# Patient Record
Sex: Female | Born: 1945 | Race: White | Hispanic: No | Marital: Married | Smoking: Former smoker
Health system: Southern US, Community
[De-identification: ages and names within clinical notes are randomized; demographics above are authoritative.]

## PROBLEM LIST (undated history)

## (undated) DIAGNOSIS — J40 Bronchitis, not specified as acute or chronic: Secondary | ICD-10-CM

---

## 2010-08-15 DIAGNOSIS — F17200 Nicotine dependence, unspecified, uncomplicated: Secondary | ICD-10-CM | POA: Insufficient documentation

## 2010-08-15 DIAGNOSIS — N3941 Urge incontinence: Secondary | ICD-10-CM | POA: Insufficient documentation

## 2010-08-15 DIAGNOSIS — K219 Gastro-esophageal reflux disease without esophagitis: Secondary | ICD-10-CM | POA: Insufficient documentation

## 2010-08-15 DIAGNOSIS — G8929 Other chronic pain: Secondary | ICD-10-CM | POA: Insufficient documentation

## 2010-08-15 DIAGNOSIS — G43909 Migraine, unspecified, not intractable, without status migrainosus: Secondary | ICD-10-CM | POA: Insufficient documentation

## 2010-08-15 DIAGNOSIS — M25859 Other specified joint disorders, unspecified hip: Secondary | ICD-10-CM | POA: Insufficient documentation

## 2010-08-15 DIAGNOSIS — J302 Other seasonal allergic rhinitis: Secondary | ICD-10-CM | POA: Insufficient documentation

## 2010-08-15 DIAGNOSIS — M8580 Other specified disorders of bone density and structure, unspecified site: Secondary | ICD-10-CM | POA: Insufficient documentation

## 2010-12-30 DIAGNOSIS — L309 Dermatitis, unspecified: Secondary | ICD-10-CM | POA: Insufficient documentation

## 2010-12-31 DIAGNOSIS — E785 Hyperlipidemia, unspecified: Secondary | ICD-10-CM | POA: Insufficient documentation

## 2012-03-29 DIAGNOSIS — N200 Calculus of kidney: Secondary | ICD-10-CM | POA: Insufficient documentation

## 2013-12-05 DIAGNOSIS — M19041 Primary osteoarthritis, right hand: Secondary | ICD-10-CM | POA: Insufficient documentation

## 2020-12-11 ENCOUNTER — Other Ambulatory Visit: Payer: Self-pay | Admitting: Family Medicine

## 2020-12-11 DIAGNOSIS — Z1231 Encounter for screening mammogram for malignant neoplasm of breast: Secondary | ICD-10-CM

## 2021-02-13 ENCOUNTER — Inpatient Hospital Stay
Admission: EM | Admit: 2021-02-13 | Discharge: 2021-02-15 | DRG: 871 | Disposition: A | Discharge status: Home / Self Care | Payer: Medicare Other | Attending: Internal Medicine | Admitting: Internal Medicine

## 2021-02-13 ENCOUNTER — Encounter: Payer: Self-pay | Admitting: Emergency Medicine

## 2021-02-13 ENCOUNTER — Other Ambulatory Visit: Payer: Self-pay

## 2021-02-13 ENCOUNTER — Emergency Department: Payer: Medicare Other

## 2021-02-13 DIAGNOSIS — R809 Proteinuria, unspecified: Secondary | ICD-10-CM | POA: Diagnosis present

## 2021-02-13 DIAGNOSIS — Z87440 Personal history of urinary (tract) infections: Secondary | ICD-10-CM | POA: Diagnosis not present

## 2021-02-13 DIAGNOSIS — R300 Dysuria: Secondary | ICD-10-CM | POA: Diagnosis present

## 2021-02-13 DIAGNOSIS — K219 Gastro-esophageal reflux disease without esophagitis: Secondary | ICD-10-CM | POA: Diagnosis present

## 2021-02-13 DIAGNOSIS — Z885 Allergy status to narcotic agent status: Secondary | ICD-10-CM | POA: Diagnosis not present

## 2021-02-13 DIAGNOSIS — E871 Hypo-osmolality and hyponatremia: Secondary | ICD-10-CM | POA: Diagnosis present

## 2021-02-13 DIAGNOSIS — Z20822 Contact with and (suspected) exposure to covid-19: Secondary | ICD-10-CM | POA: Diagnosis present

## 2021-02-13 DIAGNOSIS — R5381 Other malaise: Secondary | ICD-10-CM | POA: Diagnosis present

## 2021-02-13 DIAGNOSIS — M549 Dorsalgia, unspecified: Secondary | ICD-10-CM | POA: Diagnosis present

## 2021-02-13 DIAGNOSIS — R9431 Abnormal electrocardiogram [ECG] [EKG]: Secondary | ICD-10-CM | POA: Diagnosis not present

## 2021-02-13 DIAGNOSIS — D649 Anemia, unspecified: Secondary | ICD-10-CM | POA: Diagnosis present

## 2021-02-13 DIAGNOSIS — Z809 Family history of malignant neoplasm, unspecified: Secondary | ICD-10-CM | POA: Diagnosis not present

## 2021-02-13 DIAGNOSIS — Z87442 Personal history of urinary calculi: Secondary | ICD-10-CM

## 2021-02-13 DIAGNOSIS — J440 Chronic obstructive pulmonary disease with acute lower respiratory infection: Secondary | ICD-10-CM | POA: Diagnosis present

## 2021-02-13 DIAGNOSIS — R404 Transient alteration of awareness: Secondary | ICD-10-CM | POA: Diagnosis present

## 2021-02-13 DIAGNOSIS — E785 Hyperlipidemia, unspecified: Secondary | ICD-10-CM | POA: Diagnosis present

## 2021-02-13 DIAGNOSIS — J189 Pneumonia, unspecified organism: Secondary | ICD-10-CM | POA: Diagnosis present

## 2021-02-13 DIAGNOSIS — Z8262 Family history of osteoporosis: Secondary | ICD-10-CM

## 2021-02-13 DIAGNOSIS — Z87891 Personal history of nicotine dependence: Secondary | ICD-10-CM | POA: Diagnosis not present

## 2021-02-13 DIAGNOSIS — I4891 Unspecified atrial fibrillation: Secondary | ICD-10-CM | POA: Diagnosis present

## 2021-02-13 DIAGNOSIS — A419 Sepsis, unspecified organism: Secondary | ICD-10-CM | POA: Diagnosis present

## 2021-02-13 DIAGNOSIS — E860 Dehydration: Secondary | ICD-10-CM | POA: Diagnosis present

## 2021-02-13 DIAGNOSIS — R443 Hallucinations, unspecified: Secondary | ICD-10-CM | POA: Diagnosis present

## 2021-02-13 DIAGNOSIS — G8929 Other chronic pain: Secondary | ICD-10-CM | POA: Diagnosis present

## 2021-02-13 DIAGNOSIS — J9601 Acute respiratory failure with hypoxia: Secondary | ICD-10-CM | POA: Diagnosis present

## 2021-02-13 HISTORY — DX: Bronchitis, not specified as acute or chronic: J40

## 2021-02-13 LAB — URINALYSIS, ROUTINE W REFLEX MICROSCOPIC
Bilirubin Urine: NEGATIVE
Glucose, UA: NEGATIVE mg/dL
Ketones, ur: 20 mg/dL — AB
Nitrite: NEGATIVE
Protein, ur: 100 mg/dL — AB
Specific Gravity, Urine: 1.028 (ref 1.005–1.030)
pH: 5 (ref 5.0–8.0)

## 2021-02-13 LAB — CBC WITH DIFFERENTIAL/PLATELET
Abs Immature Granulocytes: 0.05 10*3/uL (ref 0.00–0.07)
Basophils Absolute: 0 10*3/uL (ref 0.0–0.1)
Basophils Relative: 0 %
Eosinophils Absolute: 0 10*3/uL (ref 0.0–0.5)
Eosinophils Relative: 0 %
HCT: 35.5 % — ABNORMAL LOW (ref 36.0–46.0)
Hemoglobin: 11.9 g/dL — ABNORMAL LOW (ref 12.0–15.0)
Immature Granulocytes: 0 %
Lymphocytes Relative: 5 %
Lymphs Abs: 0.6 10*3/uL — ABNORMAL LOW (ref 0.7–4.0)
MCH: 29.6 pg (ref 26.0–34.0)
MCHC: 33.5 g/dL (ref 30.0–36.0)
MCV: 88.3 fL (ref 80.0–100.0)
Monocytes Absolute: 0.9 10*3/uL (ref 0.1–1.0)
Monocytes Relative: 8 %
Neutro Abs: 9.7 10*3/uL — ABNORMAL HIGH (ref 1.7–7.7)
Neutrophils Relative %: 87 %
Platelets: 195 10*3/uL (ref 150–400)
RBC: 4.02 MIL/uL (ref 3.87–5.11)
RDW: 12.8 % (ref 11.5–15.5)
WBC: 11.3 10*3/uL — ABNORMAL HIGH (ref 4.0–10.5)
nRBC: 0 % (ref 0.0–0.2)

## 2021-02-13 LAB — COMPREHENSIVE METABOLIC PANEL
ALT: 28 U/L (ref 0–44)
AST: 36 U/L (ref 15–41)
Albumin: 3.4 g/dL — ABNORMAL LOW (ref 3.5–5.0)
Alkaline Phosphatase: 90 U/L (ref 38–126)
Anion gap: 10 (ref 5–15)
BUN: 16 mg/dL (ref 8–23)
CO2: 24 mmol/L (ref 22–32)
Calcium: 8.3 mg/dL — ABNORMAL LOW (ref 8.9–10.3)
Chloride: 98 mmol/L (ref 98–111)
Creatinine, Ser: 1.05 mg/dL — ABNORMAL HIGH (ref 0.44–1.00)
GFR, Estimated: 55 mL/min — ABNORMAL LOW (ref >60)
Glucose, Bld: 136 mg/dL — ABNORMAL HIGH (ref 70–99)
Potassium: 3.6 mmol/L (ref 3.5–5.1)
Sodium: 132 mmol/L — ABNORMAL LOW (ref 135–145)
Total Bilirubin: 1.2 mg/dL (ref 0.3–1.2)
Total Protein: 7.4 g/dL (ref 6.5–8.1)

## 2021-02-13 LAB — BRAIN NATRIURETIC PEPTIDE: B Natriuretic Peptide: 81.6 pg/mL (ref 0.0–100.0)

## 2021-02-13 LAB — BLOOD GAS, VENOUS
Acid-Base Excess: 1.3 mmol/L (ref 0.0–2.0)
Bicarbonate: 26.6 mmol/L (ref 20.0–28.0)
O2 Saturation: 44.5 %
Patient temperature: 37
pCO2, Ven: 44 mmHg (ref 44.0–60.0)
pH, Ven: 7.39 (ref 7.250–7.430)
pO2, Ven: <31 mmHg — CL (ref 32.0–45.0)

## 2021-02-13 LAB — PROTIME-INR
INR: 1.1 (ref 0.8–1.2)
Prothrombin Time: 14.3 seconds (ref 11.4–15.2)

## 2021-02-13 LAB — TROPONIN I (HIGH SENSITIVITY)
Troponin I (High Sensitivity): 16 ng/L (ref <18)
Troponin I (High Sensitivity): 14 ng/L (ref <18)

## 2021-02-13 LAB — RESP PANEL BY RT-PCR (FLU A&B, COVID) ARPGX2
Influenza A by PCR: NEGATIVE
Influenza B by PCR: NEGATIVE
SARS Coronavirus 2 by RT PCR: NEGATIVE

## 2021-02-13 LAB — PROCALCITONIN: Procalcitonin: 19.08 ng/mL

## 2021-02-13 LAB — STREP PNEUMONIAE URINARY ANTIGEN: Strep Pneumo Urinary Antigen: NEGATIVE

## 2021-02-13 LAB — TSH: TSH: 0.518 u[IU]/mL (ref 0.350–4.500)

## 2021-02-13 LAB — LACTIC ACID, PLASMA: Lactic Acid, Venous: 1.6 mmol/L (ref 0.5–1.9)

## 2021-02-13 MED ORDER — IPRATROPIUM-ALBUTEROL 0.5-2.5 (3) MG/3ML IN SOLN
3.0000 mL | Freq: Once | RESPIRATORY_TRACT | Status: AC
  Administered 2021-02-13: 3 mL via RESPIRATORY_TRACT

## 2021-02-13 MED ORDER — PREDNISONE 20 MG PO TABS
40.0000 mg | ORAL_TABLET | Freq: Every day | ORAL | Status: DC
  Administered 2021-02-14 – 2021-02-15 (×2): 40 mg via ORAL
  Filled 2021-02-13 (×2): qty 2

## 2021-02-13 MED ORDER — OXYBUTYNIN CHLORIDE 5 MG PO TABS
5.0000 mg | ORAL_TABLET | Freq: Two times a day (BID) | ORAL | Status: DC
  Administered 2021-02-13 – 2021-02-15 (×4): 5 mg via ORAL
  Filled 2021-02-13 (×5): qty 1

## 2021-02-13 MED ORDER — ROSUVASTATIN CALCIUM 10 MG PO TABS
10.0000 mg | ORAL_TABLET | Freq: Every day | ORAL | Status: DC
  Administered 2021-02-14 – 2021-02-15 (×2): 10 mg via ORAL
  Filled 2021-02-13 (×3): qty 1

## 2021-02-13 MED ORDER — HYDROXYZINE HCL 10 MG PO TABS
10.0000 mg | ORAL_TABLET | Freq: Every day | ORAL | Status: DC
  Administered 2021-02-13 – 2021-02-14 (×2): 10 mg via ORAL
  Filled 2021-02-13 (×4): qty 1

## 2021-02-13 MED ORDER — ACETAMINOPHEN 500 MG PO TABS
1000.0000 mg | ORAL_TABLET | Freq: Once | ORAL | Status: AC
  Administered 2021-02-13: 1000 mg via ORAL
  Filled 2021-02-13: qty 2

## 2021-02-13 MED ORDER — MONTELUKAST SODIUM 10 MG PO TABS
10.0000 mg | ORAL_TABLET | Freq: Every day | ORAL | Status: DC
  Administered 2021-02-14 – 2021-02-15 (×2): 10 mg via ORAL
  Filled 2021-02-13 (×2): qty 1

## 2021-02-13 MED ORDER — IPRATROPIUM-ALBUTEROL 0.5-2.5 (3) MG/3ML IN SOLN
3.0000 mL | Freq: Once | RESPIRATORY_TRACT | Status: AC
  Administered 2021-02-13: 3 mL via RESPIRATORY_TRACT
  Filled 2021-02-13: qty 6

## 2021-02-13 MED ORDER — METHYLPREDNISOLONE SODIUM SUCC 125 MG IJ SOLR
125.0000 mg | Freq: Once | INTRAMUSCULAR | Status: AC
  Administered 2021-02-13: 125 mg via INTRAVENOUS
  Filled 2021-02-13: qty 2

## 2021-02-13 MED ORDER — SODIUM CHLORIDE 0.9 % IV SOLN
500.0000 mg | Freq: Once | INTRAVENOUS | Status: AC
  Administered 2021-02-13: 500 mg via INTRAVENOUS
  Filled 2021-02-13: qty 5

## 2021-02-13 MED ORDER — PANTOPRAZOLE SODIUM 40 MG PO TBEC
40.0000 mg | DELAYED_RELEASE_TABLET | Freq: Every day | ORAL | Status: DC
  Administered 2021-02-14 – 2021-02-15 (×2): 40 mg via ORAL
  Filled 2021-02-13 (×2): qty 1

## 2021-02-13 MED ORDER — ENOXAPARIN SODIUM 40 MG/0.4ML IJ SOSY
40.0000 mg | PREFILLED_SYRINGE | INTRAMUSCULAR | Status: DC
  Administered 2021-02-13 – 2021-02-14 (×2): 40 mg via SUBCUTANEOUS
  Filled 2021-02-13 (×2): qty 0.4

## 2021-02-13 MED ORDER — GUAIFENESIN ER 600 MG PO TB12
600.0000 mg | ORAL_TABLET | Freq: Two times a day (BID) | ORAL | Status: DC
  Administered 2021-02-13 – 2021-02-15 (×5): 600 mg via ORAL
  Filled 2021-02-13 (×5): qty 1

## 2021-02-13 MED ORDER — MAGNESIUM OXIDE 400 MG PO TABS
400.0000 mg | ORAL_TABLET | Freq: Every day | ORAL | Status: DC
  Administered 2021-02-14 – 2021-02-15 (×2): 400 mg via ORAL
  Filled 2021-02-13 (×5): qty 1

## 2021-02-13 MED ORDER — VITAMIN B-12 1000 MCG PO TABS
1000.0000 ug | ORAL_TABLET | Freq: Every day | ORAL | Status: DC
  Administered 2021-02-14 – 2021-02-15 (×2): 1000 ug via ORAL
  Filled 2021-02-13 (×3): qty 1

## 2021-02-13 MED ORDER — ALBUTEROL SULFATE (2.5 MG/3ML) 0.083% IN NEBU: 2.5000 mg | INHALATION_SOLUTION | RESPIRATORY_TRACT | Status: DC | PRN

## 2021-02-13 MED ORDER — SODIUM CHLORIDE 0.9 % IV SOLN
2.0000 g | Freq: Once | INTRAVENOUS | Status: AC
  Administered 2021-02-13: 2 g via INTRAVENOUS
  Filled 2021-02-13: qty 20

## 2021-02-13 MED ORDER — SODIUM CHLORIDE 0.9 % IV SOLN: INTRAVENOUS | Status: AC

## 2021-02-13 MED ORDER — ADULT MULTIVITAMIN W/MINERALS CH
1.0000 | ORAL_TABLET | Freq: Every day | ORAL | Status: DC
  Administered 2021-02-14 – 2021-02-15 (×2): 1 via ORAL
  Filled 2021-02-13 (×2): qty 1

## 2021-02-13 NOTE — ED Triage Notes (Signed)
Pt comes into the ED via [facility] clinic for increased [facility] and AMS.  Pt recently diagnosed with bronchitis and UTI.  [facility] clinic saw her today and her O2 was in the 80's room air so they put her on 2L and brought her over.  Pt confused in triage at this time.  Pt has strong cough in triage.

## 2021-02-13 NOTE — ED Provider Notes (Signed)
[facility] Provider Note    Event Date/Time   First MD Initiated Contact with Patient 02/13/21 1019     (approximate)   History   Shortness of Breath and Altered Mental Status   HPI  Deborah Herrera is a 76 y.o. female here with shortness of breath and altered mental status.  History provided primarily by the patient's husband.  He reports that over the last week, the patient has had cough, increasing confusion, and decreased p.o. appetite.  She has been hallucinating.  She has had subjective fevers.  She has been progressively weaker and is having difficulty getting around the house today.  She went to her PCP and was sent here for further evaluation.  She denies any complaints on my assessment, though she does not necessarily know why she is here.  Denies any chest pain or shortness of breath.  No nausea or vomiting.  No diarrhea.  She has had some mild dysuria, with a history of UTIs.  No known specific sick contacts.  No known COVID exposures.  Remainder of history limited due to confusion.     Physical Exam   Triage Vital Signs: ED Triage Vitals  Enc Vitals Group     BP 02/13/21 1100 105/71     Pulse Rate 02/13/21 1031 (!) 120     Resp 02/13/21 1030 (!) 24     Temp 02/13/21 1030 99.8 F (37.7 C)     Temp Source 02/13/21 1030 Oral     SpO2 02/13/21 1030 94 %     Weight 02/13/21 1010 148 lb (67.1 kg)     Height 02/13/21 1010 5\' 5"  (1.651 m)     Head Circumference --      Peak Flow --      Pain Score 02/13/21 1010 5     Pain Loc --      Pain Edu? --      Excl. in GC? --     Most recent vital signs: Vitals:   02/13/21 1454 02/13/21 1528  BP: (!) 114/49 113/68  Pulse: (!) 114 96  Resp: 20 20  Temp:    SpO2: 94% 94%     General: Awake, no distress.  CV:  Good peripheral perfusion.  Tachycardia.  No murmur. Resp:  Normal effort.  Bilateral wheezing with slight rhonchi. Abd:  No distention.  Other:  Oriented to person and place, not time.   Moves all extremities with 5-5 strength.  Cranials 2 through 12 grossly intact.  Normal sensation light touch.  Gait deferred.   ED Results / Procedures / Treatments   Labs (all labs ordered are listed, but only abnormal results are displayed) Labs Reviewed  COMPREHENSIVE METABOLIC PANEL - Abnormal; Notable for the following components:      Result Value   Sodium 132 (*)    Glucose, Bld 136 (*)    Creatinine, Ser 1.05 (*)    Calcium 8.3 (*)    Albumin 3.4 (*)    GFR, Estimated 55 (*)    All other components within normal limits  CBC WITH DIFFERENTIAL/PLATELET - Abnormal; Notable for the following components:   WBC 11.3 (*)    Hemoglobin 11.9 (*)    HCT 35.5 (*)    Neutro Abs 9.7 (*)    Lymphs Abs 0.6 (*)    All other components within normal limits  URINALYSIS, ROUTINE W REFLEX MICROSCOPIC - Abnormal; Notable for the following components:   Color, Urine AMBER (*)  APPearance HAZY (*)    Hgb urine dipstick SMALL (*)    Ketones, ur 20 (*)    Protein, ur 100 (*)    Leukocytes,Ua TRACE (*)    Bacteria, UA RARE (*)    All other components within normal limits  BLOOD GAS, VENOUS - Abnormal; Notable for the following components:   pO2, Ven <31.0 (*)    All other components within normal limits  RESP PANEL BY RT-PCR (FLU A&B, COVID) ARPGX2  CULTURE, BLOOD (ROUTINE X 2)  CULTURE, BLOOD (ROUTINE X 2)  EXPECTORATED SPUTUM ASSESSMENT W GRAM STAIN, RFLX TO RESP C  RESPIRATORY PANEL BY PCR  MRSA NEXT GEN BY PCR, NASAL  URINE CULTURE  LACTIC ACID, PLASMA  PROTIME-INR  PROCALCITONIN  BRAIN NATRIURETIC PEPTIDE  TSH  LACTIC ACID, PLASMA  LEGIONELLA PNEUMOPHILA SEROGP 1 UR AG  STREP PNEUMONIAE URINARY ANTIGEN  HEMOGLOBIN A1C  TROPONIN I (HIGH SENSITIVITY)  TROPONIN I (HIGH SENSITIVITY)     EKG Sinus tachycardia, ventricular rate 123.  PR 152, QRS 110, QTc 460.  No acute ST elevations or depressions.  No acute ischemia or infarct.   RADIOLOGY Chest x-ray: Likely  interstitial pneumonia, no pleural effusion on my review.  Agree with radiology interpretation.    PROCEDURES:  Critical Care performed: Yes, see critical care procedure note(s)  .Critical Care Performed by: Shaune Pollack, MD Authorized by: Shaune Pollack, MD   Critical care provider statement:    Critical care time (minutes):  30   Critical care time was exclusive of:  Separately billable procedures and treating other patients   Critical care was necessary to treat or prevent imminent or life-threatening deterioration of the following conditions:  Cardiac failure, circulatory failure and sepsis   Critical care was time spent personally by me on the following activities:  Development of treatment plan with patient or surrogate, discussions with consultants, evaluation of patient's response to treatment, examination of patient, ordering and review of laboratory studies, ordering and review of radiographic studies, ordering and performing treatments and interventions, pulse oximetry, re-evaluation of patient's condition and review of old charts .1-3 Lead EKG Interpretation Performed by: Shaune Pollack, MD Authorized by: Shaune Pollack, MD     Interpretation: normal     ECG rate:  100-130   ECG rate assessment: tachycardic     Rhythm: atrial fibrillation     Ectopy: none     Conduction: normal   Comments:     Indication:Sepsis, weakness    MEDICATIONS ORDERED IN ED: Medications  enoxaparin (LOVENOX) injection 40 mg (has no administration in time range)  0.9 %  sodium chloride infusion (0 mLs Intravenous Hold 02/13/21 1332)  albuterol (PROVENTIL) (2.5 MG/3ML) 0.083% nebulizer solution 2.5 mg (has no administration in time range)  guaiFENesin (MUCINEX) 12 hr tablet 600 mg (600 mg Oral Given 02/13/21 1346)  predniSONE (DELTASONE) tablet 40 mg (has no administration in time range)  ipratropium-albuterol (DUONEB) 0.5-2.5 (3) MG/3ML nebulizer solution 3 mL (3 mLs Nebulization Given  02/13/21 1055)  acetaminophen (TYLENOL) tablet 1,000 mg (1,000 mg Oral Given 02/13/21 1057)  ipratropium-albuterol (DUONEB) 0.5-2.5 (3) MG/3ML nebulizer solution 3 mL (3 mLs Nebulization Given 02/13/21 1055)  cefTRIAXone (ROCEPHIN) 2 g in sodium chloride 0.9 % 100 mL IVPB (0 g Intravenous Stopped 02/13/21 1126)  azithromycin (ZITHROMAX) 500 mg in sodium chloride 0.9 % 250 mL IVPB (0 mg Intravenous Stopped 02/13/21 1232)  methylPREDNISolone sodium succinate (SOLU-MEDROL) 125 mg/2 mL injection 125 mg (125 mg Intravenous Given 02/13/21 1056)  IMPRESSION / MDM / ASSESSMENT AND PLAN / ED COURSE  I reviewed the triage vital signs and the nursing notes.                               The patient is on the cardiac monitor to evaluate for evidence of arrhythmia and/or significant heart rate changes.   Ddx: Sepsis 2/2 PNA, UTI, metabolic encephalopathy, polypharmacy   Plan: IV broad spectrum ABX, IVF resuscitation (30 cc/kg), admit to medicine for further stabilization/treatment.   MEDICATIONS GIVEN IN ED: Medications  enoxaparin (LOVENOX) injection 40 mg (has no administration in time range)  0.9 %  sodium chloride infusion (0 mLs Intravenous Hold 02/13/21 1332)  albuterol (PROVENTIL) (2.5 MG/3ML) 0.083% nebulizer solution 2.5 mg (has no administration in time range)  guaiFENesin (MUCINEX) 12 hr tablet 600 mg (600 mg Oral Given 02/13/21 1346)  predniSONE (DELTASONE) tablet 40 mg (has no administration in time range)  ipratropium-albuterol (DUONEB) 0.5-2.5 (3) MG/3ML nebulizer solution 3 mL (3 mLs Nebulization Given 02/13/21 1055)  acetaminophen (TYLENOL) tablet 1,000 mg (1,000 mg Oral Given 02/13/21 1057)  ipratropium-albuterol (DUONEB) 0.5-2.5 (3) MG/3ML nebulizer solution 3 mL (3 mLs Nebulization Given 02/13/21 1055)  cefTRIAXone (ROCEPHIN) 2 g in sodium chloride 0.9 % 100 mL IVPB (0 g Intravenous Stopped 02/13/21 1126)  azithromycin (ZITHROMAX) 500 mg in sodium chloride 0.9 % 250 mL IVPB (0 mg Intravenous  Stopped 02/13/21 1232)  methylPREDNISolone sodium succinate (SOLU-MEDROL) 125 mg/2 mL injection 125 mg (125 mg Intravenous Given 02/13/21 1056)     ED COURSE: Pt given tylenol, fluids, abx with improvement in vitals. BP improved. Sats stable. Labs obtained and reviewed, remarkable for leukocytosis with white blood cell count 11.3, mild hyponatremia likely due to hypovolemia but normal renal function, significantly elevated procalcitonin of 19 concerning for bacterial illness, reassuring blood gas without significant hypercapnia, UA with ketonuria consistent with dehydration, very mild pyuria.  Blood cultures have been sent.  INR normal.  Chest x-ray obtained and reviewed and is concerning for pneumonia.   Consults: Hospitalist consulted and case discussed, will admit.   EMR reviewed   Visit from Dr. Darrick Penna (PCP) visit today, where pt was febrile, tachycardic, with concern for sepsis.   FINAL CLINICAL IMPRESSION(S) / ED DIAGNOSES   Final diagnoses:  Sepsis due to pneumonia (HCC)  Transient alteration of awareness     Rx / DC Orders   ED Discharge Orders     None        Note:  This document was prepared using Dragon voice recognition software and may include unintentional dictation errors.   Shaune Pollack, MD 02/13/21 [phone]

## 2021-02-13 NOTE — H&P (Signed)
History and Physical    Deborah FurryJane Herrera RUE:[id number]:[id number] DOB: [birth year] DOA: 02/13/2021  [facility]  [facility] coming from:[place]   I have personally briefly reviewed patient's old medical [facility]  Chief Complaint:cough, congestion,sob,burning with urination  HPI: Deborah FurryJane Herrera is a 76 y.o. female with medical history significant of  HLD,  GERD, Migranes, Bell's palsy,past hx of tobacco abuse quit 6 years ago,denies history of COPD but does endorse history of frequent URIs. [facility] with complaint of cough,congestion,sob and dysuria. [facility] was noted to be altered with sat of 83% on RA, hr 130, bp 149/70. Patient was placed on 2L [place] with improvement to 90's and transferred to ED. Of note patient has been ill with URI for few weeks, however over the last few days has had progression of symptoms leading to presentation at pcp office. Per patient and husband she has been ill for over 2 weeks despite treatment with antibiotics and steroid pack as out patient. Patient states in addition to above symptoms she has had muscle aches above her typical baseline, dizziness, weakness as well as poor appetite. Per husband he was also ill with URI signs and symptoms during the same periods which have now resolved.     ED Course:  IN ED Vitals  temp 99.8, rr 24, sat 84 on 2L ,hr 120,   Labs:wbc 11.3, hb 11.9,lactic 1.6 Na 132, cr 1.05, EKG: sinus tachycardia , inferior q, interventricular conduction delay  CE within normal limts Respiratory panel :neg  BNP 81.6 Cxr: + infiltrate -pna  Patient on exam noted to have wheezing  Patient tx with nebs/solumedrol / as well as ctx azithromycin for cap  And slated for admission to medtele Review of Systems: As per HPI otherwise 10 point review of systems negative.   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 Her smoking use included cigarettes. She has never used smokeless tobacco. She reports that she does not currently use alcohol. No history on file for drug use.  Allergies  Allergen Reactions   Codeine Nausea And Vomiting    Family History   Hepatitis Mother   Bone cancer Father   Osteoporosis (Thinning of bones) Sister   Cataracts Sister  Prior to Admission medications   Not on File    Physical Exam: Vitals:   02/13/21 1030 02/13/21 1031 02/13/21 1100 02/13/21 1131  BP:   105/71 121/67  Pulse:  (!) 120  (!) 118  Resp: (!) 24   (!) 22  Temp: 99.8 F (37.7  C)     TempSrc: Oral     SpO2: 94%   94%  Weight:      Height:        Constitutional: NAD, calm, comfortable Vitals:   02/13/21 1030 02/13/21 1031 02/13/21 1100 02/13/21 1131  BP:   105/71 121/67  Pulse:  (!) 120  (!) 118  Resp: (!) 24   (!) 22  Temp: 99.8 F (37.7 C)     TempSrc: Oral     SpO2: 94%   94%  Weight:      Height:       Eyes: PERRL, lids and conjunctivae normal ENMT: Mucous membranes are moist. Posterior pharynx clear of any exudate or lesions.Normal dentition.  Neck:  normal, supple, no masses, no thyromegaly Respiratory: clear to auscultation bilaterally, faint exp wheezing, no crackles. Normal respiratory effort. No accessory muscle use.  Cardiovascular: Regular rate and rhythm, no murmurs / rubs / gallops. No extremity edema. 2+ pedal pulses. No carotid bruits.  Abdomen: no tenderness, no masses palpated. No hepatosplenomegaly. Bowel sounds positive.  Musculoskeletal: no clubbing / cyanosis. No joint deformity upper and lower extremities. Good ROM, no contractures. Normal muscle tone.  Skin: no rashes, lesions, ulcers. No induration Neurologic: CN 2-12 grossly intact. Sensation intact,  Strength 5/5 in all 4.  Psychiatric: Normal judgment and insight. Alert and oriented x 3. Normal mood.    Labs on Admission: I have personally reviewed following labs and imaging studies  CBC: Recent Labs  Lab 02/13/21 1013  WBC 11.3*  NEUTROABS 9.7*  HGB 11.9*  HCT 35.5*  MCV 88.3  PLT 195   Basic Metabolic Panel: Recent Labs  Lab 02/13/21 1013  NA 132*  K 3.6  CL 98  CO2 24  GLUCOSE 136*  BUN 16  CREATININE 1.05*  CALCIUM 8.3*   GFR: Estimated Creatinine Clearance: 40.4 mL/min (A) (by C-G formula based on SCr of 1.05 mg/dL (H)). Liver Function Tests: Recent Labs  Lab 02/13/21 1013  AST 36  ALT 28  ALKPHOS 90  BILITOT 1.2  PROT 7.4  ALBUMIN 3.4*   No results for input(s): LIPASE, AMYLASE in the last 168 hours. No results for input(s): AMMONIA in the last 168 hours. Coagulation Profile: Recent Labs  Lab 02/13/21 1013  INR 1.1   Cardiac Enzymes: No results for input(s): CKTOTAL, CKMB, CKMBINDEX, TROPONINI in the last 168 hours. BNP (last 3 results) No results for input(s): PROBNP in the last 8760 hours. HbA1C: No results for input(s): HGBA1C in the last 72 hours. CBG: No results for input(s): GLUCAP in the last 168 hours. Lipid Profile: No results for input(s): CHOL, HDL, LDLCALC, TRIG, CHOLHDL, LDLDIRECT in the last 72  hours. Thyroid Function Tests: No results for input(s): TSH, T4TOTAL, FREET4, T3FREE, THYROIDAB in the last 72 hours. Anemia Panel: No results for input(s): VITAMINB12, FOLATE, FERRITIN, TIBC, IRON, RETICCTPCT in the last 72 hours. Urine analysis:    Component Value Date/Time   COLORURINE AMBER (A) 02/13/2021 1044   APPEARANCEUR HAZY (A) 02/13/2021 1044   LABSPEC 1.028 02/13/2021 1044   PHURINE 5.0 02/13/2021 1044   GLUCOSEU NEGATIVE 02/13/2021 1044   HGBUR SMALL (A) 02/13/2021 1044   BILIRUBINUR NEGATIVE 02/13/2021 1044   KETONESUR 20 (A) 02/13/2021 1044   PROTEINUR 100 (A) 02/13/2021 1044   NITRITE NEGATIVE 02/13/2021 1044   LEUKOCYTESUR TRACE (A) 02/13/2021 1044    Radiological Exams on Admission: DG Chest 2 View  Result Date: 02/13/2021 CLINICAL DATA:  Shortness of breath, suspected sepsis  EXAM: CHEST - 2 VIEW COMPARISON:  None. FINDINGS: Cardiac size is within normal limits. There are no signs of alveolar pulmonary edema. Subtle increase in interstitial markings are seen in the left parahilar region and left lower lung fields. There is no focal consolidation. There is no pleural effusion or pneumothorax. IMPRESSION: Subtle increase in interstitial markings in the left parahilar region and left lower lung fields suggest interstitial pneumonia. Part of this finding may suggest underlying scarring. There is no focal pulmonary consolidation. There is no pleural effusion. Electronically Signed   By: Ernie Avena M.D.   On: 02/13/2021 10:47    EKG: Independently reviewed.see above  Assessment/Plan Acute hypoxic respiratory failure due to CAP/possible COPD  -continue with [place] wean as able  -continue with CAP abx per protocol  -steroids due to concern for COPD /severe bacterial bronchitis -of note patient has no formal diagnosis of COPD but has hx of tobacco abuse and has wheezing on exam  -f/u on urinary ag, blood/sputum cultures   Sepsis due to Above  - tachycardic,  tachypnea, wbc  -procal 19  - ivfs per protocol    Abn EKG -note CE within normal limits -no hx documented of CAD  -will get baseline echo for completeness   Mild hyponatremia  -thought due to low volume status -on ivfs   HLD -statin     GERD -ppi   Migranes -no active issues supportive care    Bell's palsy -no active issue   DVT prophylaxis:  lovenox  (Code Status: Full Family Communication: .husband at bedside  Disposition Plan: admit to med tele  Consults called:n/a Admission status: inpatient   Lurline Del MD [facility]  If 7PM-7AM, please contact night-coverage [email or web address] Password [email or web address]  02/13/2021, 12:19 PM

## 2021-02-14 ENCOUNTER — Encounter: Payer: Self-pay | Admitting: Internal Medicine

## 2021-02-14 ENCOUNTER — Inpatient Hospital Stay (HOSPITAL_COMMUNITY)
Admit: 2021-02-14 | Discharge: 2021-02-14 | Disposition: A | Discharge status: Home / Self Care | Payer: Medicare Other | Attending: Internal Medicine | Admitting: Internal Medicine

## 2021-02-14 DIAGNOSIS — R9431 Abnormal electrocardiogram [ECG] [EKG]: Secondary | ICD-10-CM | POA: Diagnosis not present

## 2021-02-14 DIAGNOSIS — J189 Pneumonia, unspecified organism: Secondary | ICD-10-CM

## 2021-02-14 LAB — RESPIRATORY PANEL BY PCR

## 2021-02-14 LAB — ECHOCARDIOGRAM COMPLETE
AR max vel: 2.27 cm2
AV Area VTI: 2.37 cm2
AV Area mean vel: 1.96 cm2
AV Mean grad: 2 mmHg
AV Peak grad: 3.3 mmHg
Ao pk vel: 0.9 m/s
Area-P 1/2: 5.23 cm2
Height: 65 in
MV VTI: 1.86 cm2
S' Lateral: 2.6 cm
Weight: 2368 oz

## 2021-02-14 LAB — COMPREHENSIVE METABOLIC PANEL
ALT: 31 U/L (ref 0–44)
AST: 35 U/L (ref 15–41)
Albumin: 3.2 g/dL — ABNORMAL LOW (ref 3.5–5.0)
Alkaline Phosphatase: 81 U/L (ref 38–126)
Anion gap: 12 (ref 5–15)
BUN: 18 mg/dL (ref 8–23)
CO2: 26 mmol/L (ref 22–32)
Calcium: 8.5 mg/dL — ABNORMAL LOW (ref 8.9–10.3)
Chloride: 100 mmol/L (ref 98–111)
Creatinine, Ser: 0.83 mg/dL (ref 0.44–1.00)
GFR, Estimated: >60 mL/min (ref >60)
Glucose, Bld: 120 mg/dL — ABNORMAL HIGH (ref 70–99)
Potassium: 3.7 mmol/L (ref 3.5–5.1)
Sodium: 138 mmol/L (ref 135–145)
Total Bilirubin: 0.8 mg/dL (ref 0.3–1.2)
Total Protein: 6.8 g/dL (ref 6.5–8.1)

## 2021-02-14 LAB — CBC
HCT: 30.8 % — ABNORMAL LOW (ref 36.0–46.0)
Hemoglobin: 10.3 g/dL — ABNORMAL LOW (ref 12.0–15.0)
MCH: 29.2 pg (ref 26.0–34.0)
MCHC: 33.4 g/dL (ref 30.0–36.0)
MCV: 87.3 fL (ref 80.0–100.0)
Platelets: 178 10*3/uL (ref 150–400)
RBC: 3.53 MIL/uL — ABNORMAL LOW (ref 3.87–5.11)
RDW: 13 % (ref 11.5–15.5)
WBC: 9.1 10*3/uL (ref 4.0–10.5)
nRBC: 0 % (ref 0.0–0.2)

## 2021-02-14 LAB — LEGIONELLA PNEUMOPHILA SEROGP 1 UR AG: L. pneumophila Serogp 1 Ur Ag: NEGATIVE

## 2021-02-14 LAB — HEMOGLOBIN A1C
Hgb A1c MFr Bld: 5.6 % (ref 4.8–5.6)
Mean Plasma Glucose: 114.02 mg/dL

## 2021-02-14 LAB — LACTIC ACID, PLASMA: Lactic Acid, Venous: 1.2 mmol/L (ref 0.5–1.9)

## 2021-02-14 MED ORDER — GUAIFENESIN-DM 100-10 MG/5ML PO SYRP
5.0000 mL | ORAL_SOLUTION | ORAL | Status: DC | PRN
  Administered 2021-02-14 – 2021-02-15 (×2): 5 mL via ORAL
  Filled 2021-02-14 (×2): qty 5

## 2021-02-14 MED ORDER — SODIUM CHLORIDE 0.9 % IV SOLN
2.0000 g | INTRAVENOUS | Status: DC
  Administered 2021-02-14 – 2021-02-15 (×2): 2 g via INTRAVENOUS
  Filled 2021-02-14: qty 20
  Filled 2021-02-14: qty 2

## 2021-02-14 MED ORDER — SODIUM CHLORIDE 0.9 % IV SOLN
500.0000 mg | INTRAVENOUS | Status: DC
  Administered 2021-02-14: 500 mg via INTRAVENOUS
  Filled 2021-02-14 (×2): qty 5

## 2021-02-14 NOTE — Progress Notes (Signed)
Pt completed two laps around nurse's station on RA, O2 stayed above 90.

## 2021-02-14 NOTE — Progress Notes (Signed)
*  PRELIMINARY RESULTS* Echocardiogram 2D Echocardiogram has been performed.  Sherrie Sport 02/14/2021, 1:48 PM

## 2021-02-14 NOTE — Progress Notes (Addendum)
PROGRESS NOTE  Deborah Herrera DOB: 08-Jul-1945 DOA: 02/13/2021 PCP: [place]  HPI/Recap of past 24 hours: Deborah Herrera is a 76 y.o. female with medical history significant of HLD, GERD, Migranes, Bell's palsy, past hx of tobacco abuse quit 6 years ago, denies history of COPD but does endorse history of frequent URIs who presents to [facility] with complaint of cough,congestion, sob and dysuria.  Work-up revealed acute hypoxic respiratory failure secondary to community-acquired pneumonia.  She was started on azithromycin and Rocephin empirically.  Also received IV steroids and bronchodilators.  02/14/2021: Patient was seen and examined at her bedside in the ED.  Her main complaint was of back pain.  She is not on oxygen at baseline.  And is currently on 3 L of O2 saturation of 92% on the monitor in the room.  Assessment/Plan: Principal Problem:   CAP (community acquired pneumonia)  Acute hypoxic respiratory failure secondary to community-acquired pneumonia, possible COPD exacerbation Patient has denied history of COPD, endorses prior history of tobacco abuse quit 6 years ago.  Unclear if she has had a prior evaluation, PFTs, for diagnosis of COPD. Maintain oxygen saturation greater than 90%. Currently on 3 L [place], wean off as tolerated. Bronchodilators Pulmonary toilet Incentive spirometer, flutter valve Mobilize as tolerated Follow cultures  Sepsis secondary to community-acquired pneumonia, POA Presented with leukocytosis, tachycardia and tachypnea, procalcitonin 19 Currently on Rocephin and azithromycin, continue Rest of management as stated above Monitor fever curve and WBC. Repeat procalcitonin in the morning to monitor response to antibiotics.  Resolved hyponatremia  Chronic anemia Hemoglobin downtrending, 10.3 from 11.9 No overt bleeding Continue to monitor  Proteinuria Urine protein 100 May benefit from ACE inhibitor Closely monitor renal  function  Physical debility PT OT to assess Fall precautions  Chronic back pain Analgesics as needed   Code Status: Full code  Family Communication: None at bedside  Disposition Plan: Likely will discharge to home   Consultants: None.  Procedures: None.  Antimicrobials: Azithromycin Rocephin  DVT prophylaxis: Subcu Lovenox daily.  Status is: Inpatient  Patient requires at least 2 midnights for further evaluation and treatment of present condition.      Objective: Vitals:   02/14/21 0300 02/14/21 0400 02/14/21 0528 02/14/21 0946  BP: (!) 146/66 139/80 (!) 131/58 (!) 132/101  Pulse: 89 92 99 (!) 105  Resp: 20 20 20 19   Temp:    98.1 F (36.7 C)  TempSrc:    Oral  SpO2: 99% 99% 99% 95%  Weight:      Height:       No intake or output data in the 24 hours ending 02/14/21 1336 Filed Weights   02/13/21 1010  Weight: 67.1 kg    Exam:  General: 76 y.o. year-old female well developed well nourished in no acute distress.  Alert and oriented x3. Cardiovascular: Regular rate and rhythm with no rubs or gallops.  No thyromegaly or JVD noted.   Respiratory: Mild diffuse wheezing bilaterally.  Mild rales noted at bases.  Poor inspiratory effort. Abdomen: Soft nontender nondistended with normal bowel sounds x4 quadrants. Musculoskeletal: Trace lower extremity edema. 2/4 pulses in all 4 extremities. Skin: No ulcerative lesions noted or rashes, Psychiatry: Mood is appropriate for condition and setting   Data Reviewed: CBC: Recent Labs  Lab 02/13/21 1013 02/14/21 0650  WBC 11.3* 9.1  NEUTROABS 9.7*  --   HGB 11.9* 10.3*  HCT 35.5* 30.8*  MCV 88.3 87.3  PLT 195 0000000   Basic Metabolic  Panel: Recent Labs  Lab 02/13/21 1013 02/14/21 0650  NA 132* 138  K 3.6 3.7  CL 98 100  CO2 24 26  GLUCOSE 136* 120*  BUN 16 18  CREATININE 1.05* 0.83  CALCIUM 8.3* 8.5*   GFR: Estimated Creatinine Clearance: 51.1 mL/min (by C-G formula based on SCr of 0.83  mg/dL). Liver Function Tests: Recent Labs  Lab 02/13/21 1013 02/14/21 0650  AST 36 35  ALT 28 31  ALKPHOS 90 81  BILITOT 1.2 0.8  PROT 7.4 6.8  ALBUMIN 3.4* 3.2*   No results for input(s): LIPASE, AMYLASE in the last 168 hours. No results for input(s): AMMONIA in the last 168 hours. Coagulation Profile: Recent Labs  Lab 02/13/21 1013  INR 1.1   Cardiac Enzymes: No results for input(s): CKTOTAL, CKMB, CKMBINDEX, TROPONINI in the last 168 hours. BNP (last 3 results) No results for input(s): PROBNP in the last 8760 hours. HbA1C: Recent Labs    02/14/21 0650  HGBA1C 5.6   CBG: No results for input(s): GLUCAP in the last 168 hours. Lipid Profile: No results for input(s): CHOL, HDL, LDLCALC, TRIG, CHOLHDL, LDLDIRECT in the last 72 hours. Thyroid Function Tests: Recent Labs    02/13/21 1350  TSH 0.518   Anemia Panel: No results for input(s): VITAMINB12, FOLATE, FERRITIN, TIBC, IRON, RETICCTPCT in the last 72 hours. Urine analysis:    Component Value Date/Time   COLORURINE AMBER (A) 02/13/2021 1044   APPEARANCEUR HAZY (A) 02/13/2021 1044   LABSPEC 1.028 02/13/2021 1044   PHURINE 5.0 02/13/2021 1044   GLUCOSEU NEGATIVE 02/13/2021 1044   HGBUR SMALL (A) 02/13/2021 1044   BILIRUBINUR NEGATIVE 02/13/2021 1044   KETONESUR 20 (A) 02/13/2021 1044   PROTEINUR 100 (A) 02/13/2021 1044   NITRITE NEGATIVE 02/13/2021 1044   LEUKOCYTESUR TRACE (A) 02/13/2021 1044   Sepsis Labs: @LABRCNTIP (procalcitonin:4,lacticidven:4)  ) Recent Results (from the past 240 hour(s))  Culture, blood (Routine x 2)     Status: None (Preliminary result)   Collection Time: 02/13/21 10:13 AM   Specimen: BLOOD RIGHT FOREARM  Result Value Ref Range Status   Specimen Description BLOOD RIGHT FOREARM  Final   Special Requests   Final    BOTTLES DRAWN AEROBIC AND ANAEROBIC Blood Culture adequate volume   Culture   Final    NO GROWTH < 24 HOURS Performed at [facility], [address] [place], [place] [address]    Report Status PENDING  Incomplete  Culture, blood (Routine x 2)     Status: None (Preliminary result)   Collection Time: 02/13/21 10:18 AM   Specimen: BLOOD LEFT FOREARM  Result Value Ref Range Status   Specimen Description BLOOD LEFT FOREARM  Final   Special Requests   Final    BOTTLES DRAWN AEROBIC AND ANAEROBIC Blood Culture adequate volume   Culture   Final    NO GROWTH < 24 HOURS Performed at [facility], [address], [place], [place] [address]    Report Status PENDING  Incomplete  Resp Panel by RT-PCR (Flu A&B, Covid) Nasopharyngeal Swab     Status: None   Collection Time: 02/13/21 10:22 AM   Specimen: Nasopharyngeal Swab; Nasopharyngeal(NP) swabs in vial transport medium  Result Value Ref Range Status   SARS Coronavirus 2 by RT PCR NEGATIVE NEGATIVE Final    Comment: (NOTE) SARS-CoV-2 target nucleic acids are NOT DETECTED.  The SARS-CoV-2 RNA is generally detectable in upper respiratory specimens during the acute phase of infection. The lowest concentration  of SARS-CoV-2 viral copies this assay can detect is 138 copies/mL. A negative result does not preclude SARS-Cov-2 infection and should not be used as the sole basis for treatment or other patient management decisions. A negative result may occur with  improper specimen collection/handling, submission of specimen other than nasopharyngeal swab, presence of viral mutation(s) within the areas targeted by this assay, and inadequate number of viral copies(<138 copies/mL). A negative result must be combined with clinical observations, patient history, and epidemiological information. The expected result is Negative.  Fact Sheet for Patients:  EntrepreneurPulse.com.au  Fact Sheet for Healthcare Providers:  IncredibleEmployment.be  This test is no t yet approved or cleared by the [place] FDA and  has been authorized for detection and/or  diagnosis of SARS-CoV-2 by FDA under an Emergency Use Authorization (EUA). This EUA will remain  in effect (meaning this test can be used) for the duration of the COVID-19 declaration under Section 564(b)(1) of the Act, 21 U.S.C.section 360bbb-3(b)(1), unless the authorization is terminated  or revoked sooner.       Influenza A by PCR NEGATIVE NEGATIVE Final   Influenza B by PCR NEGATIVE NEGATIVE Final    Comment: (NOTE) The Xpert Xpress SARS-CoV-2/FLU/RSV plus assay is intended as an aid in the diagnosis of influenza from Nasopharyngeal swab specimens and should not be used as a sole basis for treatment. Nasal washings and aspirates are unacceptable for Xpert Xpress SARS-CoV-2/FLU/RSV testing.  Fact Sheet for Patients: EntrepreneurPulse.com.au  Fact Sheet for Healthcare Providers: IncredibleEmployment.be  This test is not yet approved or cleared by the [place] FDA and has been authorized for detection and/or diagnosis of SARS-CoV-2 by FDA under an Emergency Use Authorization (EUA). This EUA will remain in effect (meaning this test can be used) for the duration of the COVID-19 declaration under Section 564(b)(1) of the Act, 21 U.S.C. section 360bbb-3(b)(1), unless the authorization is terminated or revoked.  Performed at [facility], [place], [place] [address]   Respiratory (~20 pathogens) panel by PCR     Status: None   Collection Time: 02/14/21  4:24 AM   Specimen: Nasopharyngeal Swab; Respiratory  Result Value Ref Range Status   Adenovirus NOT DETECTED NOT DETECTED Final   Coronavirus 229E NOT DETECTED NOT DETECTED Final    Comment: (NOTE) The Coronavirus on the Respiratory Panel, DOES NOT test for the novel  Coronavirus (2019 nCoV)    Coronavirus HKU1 NOT DETECTED NOT DETECTED Final   Coronavirus NL63 NOT DETECTED NOT DETECTED Final   Coronavirus OC43 NOT DETECTED NOT DETECTED Final    Metapneumovirus NOT DETECTED NOT DETECTED Final   Rhinovirus / Enterovirus NOT DETECTED NOT DETECTED Final   Influenza A NOT DETECTED NOT DETECTED Final   Influenza B NOT DETECTED NOT DETECTED Final   Parainfluenza Virus 1 NOT DETECTED NOT DETECTED Final   Parainfluenza Virus 2 NOT DETECTED NOT DETECTED Final   Parainfluenza Virus 3 NOT DETECTED NOT DETECTED Final   Parainfluenza Virus 4 NOT DETECTED NOT DETECTED Final   Respiratory Syncytial Virus NOT DETECTED NOT DETECTED Final   Bordetella pertussis NOT DETECTED NOT DETECTED Final   Bordetella Parapertussis NOT DETECTED NOT DETECTED Final   Chlamydophila pneumoniae NOT DETECTED NOT DETECTED Final   Mycoplasma pneumoniae NOT DETECTED NOT DETECTED Final    Comment: Performed at [facility], [place]. [address] [place], [place] [address]      Studies: No results found.  Scheduled Meds:  enoxaparin (LOVENOX) injection  40 mg Subcutaneous  Q24H   guaiFENesin  600 mg Oral BID   hydrOXYzine  10 mg Oral QHS   magnesium oxide  400 mg Oral Daily   montelukast  10 mg Oral Daily   multivitamin with minerals  1 tablet Oral Daily   oxybutynin  5 mg Oral BID   pantoprazole  40 mg Oral Daily   predniSONE  40 mg Oral Q breakfast   rosuvastatin  10 mg Oral Daily   cyanocobalamin  1,000 mcg Oral Daily    Continuous Infusions:  azithromycin (ZITHROMAX) 500 MG IVPB (Vial-Mate [employer])     cefTRIAXone (ROCEPHIN)  IV 2 g (02/14/21 [id number])     LOS: 1 day     Kayleen Memos, MD [facility] Pager [phone]  If 7PM-7AM, please contact night-coverage [email or web address] Password [email or web address] 02/14/2021, 1:36 PM

## 2021-02-15 DIAGNOSIS — J189 Pneumonia, unspecified organism: Secondary | ICD-10-CM | POA: Diagnosis not present

## 2021-02-15 LAB — LACTIC ACID, PLASMA: Lactic Acid, Venous: 1.6 mmol/L (ref 0.5–1.9)

## 2021-02-15 LAB — URINE CULTURE: Culture: <10000 — AB

## 2021-02-15 LAB — PROCALCITONIN: Procalcitonin: 7.87 ng/mL

## 2021-02-15 MED ORDER — BENZONATATE 100 MG PO CAPS: 200.0000 mg | ORAL_CAPSULE | Freq: Three times a day (TID) | ORAL | Status: DC

## 2021-02-15 MED ORDER — CEFDINIR 300 MG PO CAPS: 300.0000 mg | ORAL_CAPSULE | Freq: Two times a day (BID) | ORAL | 0 refills | Status: AC

## 2021-02-15 MED ORDER — CEFDINIR 300 MG PO CAPS: 300.0000 mg | ORAL_CAPSULE | Freq: Two times a day (BID) | ORAL | Status: DC

## 2021-02-15 MED ORDER — BENZONATATE 200 MG PO CAPS: 200.0000 mg | ORAL_CAPSULE | Freq: Three times a day (TID) | ORAL | 0 refills | Status: DC

## 2021-02-15 MED ORDER — PREDNISONE 20 MG PO TABS: 40.0000 mg | ORAL_TABLET | Freq: Every day | ORAL | 0 refills | Status: AC

## 2021-02-15 MED ORDER — AZITHROMYCIN 250 MG PO TABS: 250.0000 mg | ORAL_TABLET | Freq: Every day | ORAL | Status: DC

## 2021-02-15 MED ORDER — AZITHROMYCIN 250 MG PO TABS: ORAL_TABLET | ORAL | 0 refills | Status: DC

## 2021-02-15 MED ORDER — ALBUTEROL SULFATE (2.5 MG/3ML) 0.083% IN NEBU
2.5000 mg | INHALATION_SOLUTION | RESPIRATORY_TRACT | 0 refills | Status: AC | PRN
Start: 2021-02-15 — End: ?

## 2021-02-15 NOTE — Discharge Summary (Signed)
Discharge Summary  Deborah Herrera PFY:[id number] DOB: 06-30-1945  PCP: [facility]  Admit date: 02/13/2021 Discharge date: 02/15/2021  Time spent: 35 minutes.  Recommendations for Outpatient Follow-up:  Follow-up with your primary care provider within a week. Take your medications as prescribed. Stable continue to completely abstain from tobacco use or secondhand smoking.  Discharge Diagnoses:  Active Hospital Problems   Diagnosis Date Noted   CAP (community acquired pneumonia) 02/13/2021    Resolved Hospital Problems  No resolved problems to display.    Discharge Condition: Stable  Diet recommendation: Resume previous diet.    Vitals:   02/15/21 0419 02/15/21 0744  BP: (!) 151/80 110/86  Pulse: (!) 106 (!) 101  Resp: 18 18  Temp: (!) 97.5 F (36.4 C) 97.6 F (36.4 C)  SpO2: 95% 92%    History of present illness:  Deborah Herrera is a 76 y.o. female with medical history significant of HLD, GERD, Migranes, Bell's palsy, past hx of tobacco abuse quit 6 years ago, denies history of COPD but does endorse history of frequent URIs who presents to [facility] ED with complaint of cough,congestion, dyspnea and dysuria.  Work-up revealed acute hypoxic respiratory failure secondary to community-acquired pneumonia.  UA and urine culture were unremarkable.  She was started on azithromycin and Rocephin empirically.  Also received IV steroids and bronchodilators.  Initially requiring 3 L to maintain her oxygen saturation above 90%.  She was weaned off oxygen supplementation successfully.  O2 saturation 95% on room air on 02/15/2021.   02/15/2021: Seen and examined with her husband at her bedside.  There were no acute events overnight.  She has no new complaints and she is eager to go home.  Hospital Course:  Principal Problem:   CAP (community acquired pneumonia)  Resolved acute hypoxic respiratory failure secondary to community-acquired pneumonia, possible COPD exacerbation Patient has denied  history of COPD, endorses prior history of tobacco abuse quit 6 years ago.  Unclear if she has had a prior evaluation, PFTs, for diagnosis of COPD. Initially requiring 3 L nasal cannula to maintain O2 saturation greater than 90%. Currently on room air, 95% oxygen saturation. Continue to mobilize as tolerated.   Resolved sepsis secondary to community-acquired pneumonia, POA Presented with leukocytosis, tachycardia and tachypnea, procalcitonin 19 Leukocytosis has resolved, afebrile.  Procalcitonin downtrending to 7 from 19. Received 3 days of Rocephin and azithromycin. Switch to oral antibiotics, continue cefdinir 300 mg twice daily x4 days and azithromycin to 50 mg daily x4 days. Continue prednisone 40 mg daily x4 days. Albuterol nebs 3 times daily as needed for shortness of breath or wheezing Tessalon Perles as needed for cough. Follow-up with your primary care provider.   Resolved hyponatremia Serum sodium 138 from 132.   Chronic normocytic anemia Hemoglobin downtrending, 10.3 from 11.9 No overt bleeding Follow-up with your primary care provider.   Proteinuria Urine protein 100 May benefit from ACE inhibitor Follow-up with your PCP within a week.   Physical debility Seen by PT, no need identified. Continue fall precautions    Code Status: Full code   Family Communication: Husband at bedside on the day of discharge.    Consultants: None.   Procedures: None.   Antimicrobials: Azithromycin, DC'd on 02/15/2021. Rocephin, DC'd on 02/15/2021. Cefdinir 300 mg twice daily x4 days Azithromycin 250 mg daily x4 days.    Discharge Exam: BP 110/86 (BP Location: Left Arm)    Pulse (!) 101    Temp 97.6 F (36.4 C) (Oral)    Resp  18    Ht 5\' 5"  (1.651 m)    Wt 31.4 kg Comment: Made RN aware of weight difference   SpO2 92%    BMI 11.52 kg/m  General: 76 y.o. year-old female well developed well nourished in no acute distress.  Alert and oriented x3. Cardiovascular: Regular rate and  rhythm with no rubs or gallops.  No thyromegaly or JVD noted.   Respiratory: Clear to auscultation with no wheezes or rales. Good inspiratory effort. Abdomen: Soft nontender nondistended with normal bowel sounds x4 quadrants. Musculoskeletal: No lower extremity edema. 2/4 pulses in all 4 extremities. Skin: No ulcerative lesions noted or rashes, Psychiatry: Mood is appropriate for condition and setting  Discharge Instructions You were cared for by a hospitalist during your hospital stay. If you have any questions about your discharge medications or the care you received while you were in the hospital after you are discharged, you can call the unit and asked to speak with the hospitalist on call if the hospitalist that took care of you is not available. Once you are discharged, your primary care physician will handle any further medical issues. Please note that NO REFILLS for any discharge medications will be authorized once you are discharged, as it is imperative that you return to your primary care physician (or establish a relationship with a primary care physician if you do not have one) for your aftercare needs so that they can reassess your need for medications and monitor your lab values.   Allergies as of 02/15/2021       Reactions   Codeine Nausea And Vomiting, Nausea Only        Medication List     TAKE these medications    albuterol (2.5 MG/3ML) 0.083% nebulizer solution Commonly known as: PROVENTIL Take 3 mLs (2.5 mg total) by nebulization every 2 (two) hours as needed for wheezing.   azithromycin 250 MG tablet Commonly known as: ZITHROMAX Take 1 tablet once a day   benzonatate 200 MG capsule Commonly known as: TESSALON Take 1 capsule (200 mg total) by mouth 3 (three) times daily.   Calcium Carb-Cholecalciferol 600-10 MG-MCG Tabs Take 1 tablet by mouth 2 (two) times daily with a meal.   cefdinir 300 MG capsule Commonly known as: OMNICEF Take 1 capsule (300 mg total)  by mouth 2 (two) times daily for 4 days.   Cholecalciferol 25 MCG (1000 UT) capsule Take 1 tablet by mouth daily.   cyanocobalamin 1000 MCG tablet Take 1,000 mcg by mouth daily.   hydrOXYzine 10 MG tablet Commonly known as: ATARAX Take 10-20 mg by mouth at bedtime.   magnesium oxide 400 MG tablet Commonly known as: MAG-OX Take 1 tablet by mouth daily.   meloxicam 15 MG tablet Commonly known as: MOBIC Take 15 mg by mouth daily.   montelukast 10 MG tablet Commonly known as: SINGULAIR Take 10 mg by mouth daily.   Multi-Vitamin tablet Take 1 tablet by mouth daily.   oxybutynin 5 MG tablet Commonly known as: DITROPAN Take 5 mg by mouth 2 (two) times daily.   predniSONE 20 MG tablet Commonly known as: DELTASONE Take 2 tablets (40 mg total) by mouth daily with breakfast for 4 days. Start taking on: February 16, 2021   RABEprazole 20 MG tablet Commonly known as: ACIPHEX Take 20 mg by mouth daily.   rosuvastatin 10 MG tablet Commonly known as: CRESTOR Take 10 mg by mouth daily.  Durable Medical Equipment  (From admission, onward)           Start     Ordered   02/15/21 0858  For home use only DME Nebulizer machine  Once       Question Answer Comment  Patient needs a nebulizer to treat with the following condition Acute exacerbation of chronic obstructive pulmonary disease (COPD) ([place])   Length of Need 6 Months      02/15/21 0858           Allergies  Allergen Reactions   Codeine Nausea And Vomiting and Nausea Only    Follow-up Information     [place] on 02/19/2021.   Why: Please call for a post hospital follow up appointment 11am appointment Contact information: [place] [address] [phone]                  The results of significant diagnostics from this hospitalization (including imaging, microbiology, ancillary and laboratory) are listed below for reference.    Significant  Diagnostic Studies: DG Chest 2 View  Result Date: 02/13/2021 CLINICAL DATA:  Shortness of breath, suspected sepsis EXAM: CHEST - 2 VIEW COMPARISON:  None. FINDINGS: Cardiac size is within normal limits. There are no signs of alveolar pulmonary edema. Subtle increase in interstitial markings are seen in the left parahilar region and left lower lung fields. There is no focal consolidation. There is no pleural effusion or pneumothorax. IMPRESSION: Subtle increase in interstitial markings in the left parahilar region and left lower lung fields suggest interstitial pneumonia. Part of this finding may suggest underlying scarring. There is no focal pulmonary consolidation. There is no pleural effusion. Electronically Signed   By: Elmer Picker M.D.   On: 02/13/2021 10:47   ECHOCARDIOGRAM COMPLETE  Result Date: 02/14/2021    ECHOCARDIOGRAM REPORT   Patient Name:   Deborah Herrera Date of Exam: 02/14/2021 Medical Rec #:  [id number]   Height:       65.0 in Accession #:    [id number]  Weight:       148.0 lb Date of Birth:  June 01, 1945   BSA:          1.741 m Patient Age:    39 years    BP:           132/101 mmHg Patient Gender: F           HR:           105 bpm. Exam Location:  [facility] Procedure: 2D Echo, Cardiac Doppler and Color Doppler Indications:     Abnormal ECG R94.31  History:         Patient has no prior history of Echocardiogram examinations. No                  cardiac history listed in chart.                  Bronchitis.  Sonographer:     Sherrie Sport Referring Phys:  [id number] SARA-MAIZ A THOMAS Diagnosing Phys: Ida Rogue MD  Sonographer Comments: Suboptimal apical window. IMPRESSIONS  1. Left ventricular ejection fraction, by estimation, is 60 to 65%. The left ventricle has normal function. The left ventricle has no regional wall motion abnormalities. Left ventricular diastolic parameters are indeterminate.  2. Right ventricular systolic function is normal. The right ventricular size is normal. There is normal  pulmonary artery systolic pressure. The estimated right ventricular systolic pressure is Q000111Q mmHg.  3. The mitral valve is normal in structure. No evidence of mitral valve regurgitation. No evidence of mitral stenosis.  4. The aortic valve is normal in structure. Aortic valve regurgitation is not visualized. No aortic stenosis is present.  5. The inferior vena cava is normal in size with greater than 50% respiratory variability, suggesting right atrial pressure of 3 mmHg. FINDINGS  Left Ventricle: Left ventricular ejection fraction, by estimation, is 60 to 65%. The left ventricle has normal function. The left ventricle has no regional wall motion abnormalities. The left ventricular internal cavity size was normal in size. There is  no left ventricular hypertrophy. Left ventricular diastolic parameters are indeterminate. Right Ventricle: The right ventricular size is normal. No increase in right ventricular wall thickness. Right ventricular systolic function is normal. There is normal pulmonary artery systolic pressure. The tricuspid regurgitant velocity is 2.67 m/s, and  with an assumed right atrial pressure of 5 mmHg, the estimated right ventricular systolic pressure is Q000111Q mmHg. Left Atrium: Left atrial size was normal in size. Right Atrium: Right atrial size was normal in size. Pericardium: There is no evidence of pericardial effusion. Mitral Valve: The mitral valve is normal in structure. No evidence of mitral valve regurgitation. No evidence of mitral valve stenosis. MV peak gradient, 11.7 mmHg. The mean mitral valve gradient is 4.0 mmHg. Tricuspid Valve: The tricuspid valve is normal in structure. Tricuspid valve regurgitation is mild . No evidence of tricuspid stenosis. Aortic Valve: The aortic valve is normal in structure. Aortic valve regurgitation is not visualized. No aortic stenosis is present. Aortic valve mean gradient measures 2.0 mmHg. Aortic valve peak gradient measures 3.3 mmHg. Aortic valve  area, by VTI measures 2.37 cm. Pulmonic Valve: The pulmonic valve was normal in structure. Pulmonic valve regurgitation is not visualized. No evidence of pulmonic stenosis. Aorta: The aortic root is normal in size and structure. Venous: The inferior vena cava is normal in size with greater than 50% respiratory variability, suggesting right atrial pressure of 3 mmHg. IAS/Shunts: No atrial level shunt detected by color flow Doppler.  LEFT VENTRICLE PLAX 2D LVIDd:         3.80 cm   Diastology LVIDs:         2.60 cm   LV e' medial:    5.87 cm/s LV PW:         1.00 cm   LV E/e' medial:  10.5 LV IVS:        1.40 cm   LV e' lateral:   4.46 cm/s LVOT diam:     2.00 cm   LV E/e' lateral: 13.8 LV SV:         38 LV SV Index:   22 LVOT Area:     3.14 cm  RIGHT VENTRICLE RV S prime:     17.30 cm/s TAPSE (M-mode): 3.6 cm LEFT ATRIUM             Index        RIGHT ATRIUM           Index LA diam:        3.60 cm 2.07 cm/m   RA Area:     10.10 cm LA Vol (A2C):   48.3 ml 27.75 ml/m  RA Volume:   21.30 ml  12.24 ml/m LA Vol (A4C):   26.5 ml 15.23 ml/m LA Biplane Vol: 35.5 ml 20.40 ml/m  AORTIC VALVE  PULMONIC VALVE AV Area (Vmax):    2.27 cm     PV Vmax:        0.73 m/s AV Area (Vmean):   1.96 cm     PV Vmean:       47.800 cm/s AV Area (VTI):     2.37 cm     PV VTI:         0.109 m AV Vmax:           90.40 cm/s   PV Peak grad:   2.1 mmHg AV Vmean:          64.400 cm/s  PV Mean grad:   1.0 mmHg AV VTI:            0.159 m      RVOT Peak grad: 1 mmHg AV Peak Grad:      3.3 mmHg AV Mean Grad:      2.0 mmHg LVOT Vmax:         65.30 cm/s LVOT Vmean:        40.100 cm/s LVOT VTI:          0.120 m LVOT/AV VTI ratio: 0.75  AORTA Ao Root diam: 2.77 cm MITRAL VALVE                TRICUSPID VALVE MV Area (PHT): 5.23 cm     TR Peak grad:   28.5 mmHg MV Area VTI:   1.86 cm     TR Vmax:        267.00 cm/s MV Peak grad:  11.7 mmHg MV Mean grad:  4.0 mmHg     SHUNTS MV Vmax:       1.71 m/s     Systemic VTI:  0.12 m MV  Vmean:      90.7 cm/s    Systemic Diam: 2.00 cm MV Decel Time: 145 msec     Pulmonic VTI:  0.086 m MV E velocity: 61.70 cm/s MV A velocity: 126.00 cm/s MV E/A ratio:  0.49 Ida Rogue MD Electronically signed by Ida Rogue MD Signature Date/Time: 02/14/2021/6:35:32 PM    Final     Microbiology: Recent Results (from the past 240 hour(s))  Culture, blood (Routine x 2)     Status: None (Preliminary result)   Collection Time: 02/13/21 10:13 AM   Specimen: BLOOD RIGHT FOREARM  Result Value Ref Range Status   Specimen Description BLOOD RIGHT FOREARM  Final   Special Requests   Final    BOTTLES DRAWN AEROBIC AND ANAEROBIC Blood Culture adequate volume   Culture   Final    NO GROWTH 2 DAYS Performed at [facility], [address] [place], [place] [address]    Report Status PENDING  Incomplete  Culture, blood (Routine x 2)     Status: None (Preliminary result)   Collection Time: 02/13/21 10:18 AM   Specimen: BLOOD LEFT FOREARM  Result Value Ref Range Status   Specimen Description BLOOD LEFT FOREARM  Final   Special Requests   Final    BOTTLES DRAWN AEROBIC AND ANAEROBIC Blood Culture adequate volume   Culture   Final    NO GROWTH 2 DAYS Performed at [facility], [address] [place], [place] [address]    Report Status PENDING  Incomplete  Resp Panel by RT-PCR (Flu A&B, Covid) Nasopharyngeal Swab     Status: None   Collection Time: 02/13/21 10:22 AM   Specimen: Nasopharyngeal Swab; Nasopharyngeal(NP) swabs in vial transport medium  Result Value  Ref Range Status   SARS Coronavirus 2 by RT PCR NEGATIVE NEGATIVE Final    Comment: (NOTE) SARS-CoV-2 target nucleic acids are NOT DETECTED.  The SARS-CoV-2 RNA is generally detectable in upper respiratory specimens during the acute phase of infection. The lowest concentration of SARS-CoV-2 viral copies this assay can detect is 138 copies/mL. A negative result does not preclude SARS-Cov-2 infection and should not be  used as the sole basis for treatment or other patient management decisions. A negative result may occur with  improper specimen collection/handling, submission of specimen other than nasopharyngeal swab, presence of viral mutation(s) within the areas targeted by this assay, and inadequate number of viral copies(<138 copies/mL). A negative result must be combined with clinical observations, patient history, and epidemiological information. The expected result is Negative.  Fact Sheet for Patients:  EntrepreneurPulse.com.au  Fact Sheet for Healthcare Providers:  IncredibleEmployment.be  This test is no t yet approved or cleared by the [place] FDA and  has been authorized for detection and/or diagnosis of SARS-CoV-2 by FDA under an Emergency Use Authorization (EUA). This EUA will remain  in effect (meaning this test can be used) for the duration of the COVID-19 declaration under Section 564(b)(1) of the Act, 21 U.S.C.section 360bbb-3(b)(1), unless the authorization is terminated  or revoked sooner.       Influenza A by PCR NEGATIVE NEGATIVE Final   Influenza B by PCR NEGATIVE NEGATIVE Final    Comment: (NOTE) The Xpert Xpress SARS-CoV-2/FLU/RSV plus assay is intended as an aid in the diagnosis of influenza from Nasopharyngeal swab specimens and should not be used as a sole basis for treatment. Nasal washings and aspirates are unacceptable for Xpert Xpress SARS-CoV-2/FLU/RSV testing.  Fact Sheet for Patients: EntrepreneurPulse.com.au  Fact Sheet for Healthcare Providers: IncredibleEmployment.be  This test is not yet approved or cleared by the [place] FDA and has been authorized for detection and/or diagnosis of SARS-CoV-2 by FDA under an Emergency Use Authorization (EUA). This EUA will remain in effect (meaning this test can be used) for the duration of the COVID-19 declaration under Section  564(b)(1) of the Act, 21 U.S.C. section 360bbb-3(b)(1), unless the authorization is terminated or revoked.  Performed at [facility], [address], [place], [place] [address]   Urine Culture     Status: Abnormal   Collection Time: 02/13/21 10:44 AM   Specimen: Urine, Clean Catch  Result Value Ref Range Status   Specimen Description   Final    URINE, CLEAN CATCH Performed at [facility], [address] [place], [place] [address]    Special Requests   Final    NONE Performed at [facility], [address] [place], [place] [address]    Culture (A)  Final    <10,000 COLONIES/mL INSIGNIFICANT GROWTH Performed at [facility] [address], [place], [place] [address]    Report Status 02/15/2021 FINAL  Final  Respiratory (~20 pathogens) panel by PCR     Status: None   Collection Time: 02/14/21  4:24 AM   Specimen: Nasopharyngeal Swab; Respiratory  Result Value Ref Range Status   Adenovirus NOT DETECTED NOT DETECTED Final   Coronavirus 229E NOT DETECTED NOT DETECTED Final    Comment: (NOTE) The Coronavirus on the Respiratory Panel, DOES NOT test for the novel  Coronavirus (2019 nCoV)    Coronavirus HKU1 NOT DETECTED NOT DETECTED Final   Coronavirus NL63 NOT DETECTED NOT DETECTED Final   Coronavirus OC43 NOT DETECTED NOT DETECTED Final  Metapneumovirus NOT DETECTED NOT DETECTED Final   Rhinovirus / Enterovirus NOT DETECTED NOT DETECTED Final   Influenza A NOT DETECTED NOT DETECTED Final   Influenza B NOT DETECTED NOT DETECTED Final   Parainfluenza Virus 1 NOT DETECTED NOT DETECTED Final   Parainfluenza Virus 2 NOT DETECTED NOT DETECTED Final   Parainfluenza Virus 3 NOT DETECTED NOT DETECTED Final   Parainfluenza Virus 4 NOT DETECTED NOT DETECTED Final   Respiratory Syncytial Virus NOT DETECTED NOT DETECTED Final   Bordetella pertussis NOT DETECTED NOT DETECTED Final   Bordetella Parapertussis NOT DETECTED NOT DETECTED Final    Chlamydophila pneumoniae NOT DETECTED NOT DETECTED Final   Mycoplasma pneumoniae NOT DETECTED NOT DETECTED Final    Comment: Performed at [facility], [place] [address], [place], [place] [address]     Labs: Basic Metabolic Panel: Recent Labs  Lab 02/13/21 1013 02/14/21 0650  NA 132* 138  K 3.6 3.7  CL 98 100  CO2 24 26  GLUCOSE 136* 120*  BUN 16 18  CREATININE 1.05* 0.83  CALCIUM 8.3* 8.5*   Liver Function Tests: Recent Labs  Lab 02/13/21 1013 02/14/21 0650  AST 36 35  ALT 28 31  ALKPHOS 90 81  BILITOT 1.2 0.8  PROT 7.4 6.8  ALBUMIN 3.4* 3.2*   No results for input(s): LIPASE, AMYLASE in the last 168 hours. No results for input(s): AMMONIA in the last 168 hours. CBC: Recent Labs  Lab 02/13/21 1013 02/14/21 0650  WBC 11.3* 9.1  NEUTROABS 9.7*  --   HGB 11.9* 10.3*  HCT 35.5* 30.8*  MCV 88.3 87.3  PLT 195 178   Cardiac Enzymes: No results for input(s): CKTOTAL, CKMB, CKMBINDEX, TROPONINI in the last 168 hours. BNP: BNP (last 3 results) Recent Labs    02/13/21 1022  BNP 81.6    ProBNP (last 3 results) No results for input(s): PROBNP in the last 8760 hours.  CBG: No results for input(s): GLUCAP in the last 168 hours.     Signed:  Kayleen Memos, MD [facility] 02/15/2021, 10:44 AM

## 2021-02-15 NOTE — Progress Notes (Signed)
Deborah Herrera to be D/C'd Home per MD order.  Discussed prescriptions and follow up appointments with the patient. Prescriptions given to patient, medication list explained in detail. Pt verbalized understanding.  Allergies as of 02/15/2021       Reactions   Codeine Nausea And Vomiting, Nausea Only        Medication List     TAKE these medications    albuterol (2.5 MG/3ML) 0.083% nebulizer solution Commonly known as: PROVENTIL Take 3 mLs (2.5 mg total) by nebulization every 2 (two) hours as needed for wheezing.   azithromycin 250 MG tablet Commonly known as: ZITHROMAX Take 1 tablet once a day   benzonatate 200 MG capsule Commonly known as: TESSALON Take 1 capsule (200 mg total) by mouth 3 (three) times daily.   Calcium Carb-Cholecalciferol 600-10 MG-MCG Tabs Take 1 tablet by mouth 2 (two) times daily with a meal.   cefdinir 300 MG capsule Commonly known as: OMNICEF Take 1 capsule (300 mg total) by mouth 2 (two) times daily for 4 days.   Cholecalciferol 25 MCG (1000 UT) capsule Take 1 tablet by mouth daily.   cyanocobalamin 1000 MCG tablet Take 1,000 mcg by mouth daily.   hydrOXYzine 10 MG tablet Commonly known as: ATARAX Take 10-20 mg by mouth at bedtime.   magnesium oxide 400 MG tablet Commonly known as: MAG-OX Take 1 tablet by mouth daily.   meloxicam 15 MG tablet Commonly known as: MOBIC Take 15 mg by mouth daily.   montelukast 10 MG tablet Commonly known as: SINGULAIR Take 10 mg by mouth daily.   Multi-Vitamin tablet Take 1 tablet by mouth daily.   oxybutynin 5 MG tablet Commonly known as: DITROPAN Take 5 mg by mouth 2 (two) times daily.   predniSONE 20 MG tablet Commonly known as: DELTASONE Take 2 tablets (40 mg total) by mouth daily with breakfast for 4 days. Start taking on: February 16, 2021   RABEprazole 20 MG tablet Commonly known as: ACIPHEX Take 20 mg by mouth daily.   rosuvastatin 10 MG tablet Commonly known as: CRESTOR Take 10 mg by  mouth daily.               Durable Medical Equipment  (From admission, onward)           Start     Ordered   02/15/21 0858  For home use only DME Nebulizer machine  Once       Question Answer Comment  Patient needs a nebulizer to treat with the following condition Acute exacerbation of chronic obstructive pulmonary disease (COPD) ([facility])   Length of Need 6 Months      02/15/21 0858            Vitals:   02/15/21 0419 02/15/21 0744  BP: (!) 151/80 110/86  Pulse: (!) 106 (!) 101  Resp: 18 18  Temp: (!) 97.5 F (36.4 C) 97.6 F (36.4 C)  SpO2: 95% 92%    Skin clean, dry and intact without evidence of skin break down, no evidence of skin tears noted. IV catheter discontinued intact. Site without signs and symptoms of complications. Dressing and pressure applied. Pt denies pain at this time. No complaints noted.  An After Visit Summary was printed and given to the patient. Patient escorted via WC, and D/C home via private auto.  Rigoberto Noel

## 2021-02-15 NOTE — Progress Notes (Signed)
PT Cancellation Note  Patient Details Name: Deborah Herrera MRN: [id number] DOB: 06/18/1945   Cancelled Treatment:    Reason Eval/Treat Not Completed: PT screened, no needs identified, will sign off. Pt found standing independently at sink doing her hair preparing for discharge. Pt and spouse both agree pt back to baseline functionally with no need for skilled PT services. Will complete PT orders at this time but will reassess pt pending a change in status upon receipt of new PT orders.   Ovidio Hanger PT, DPT 02/15/21, 9:59 AM

## 2021-02-18 LAB — CULTURE, BLOOD (ROUTINE X 2)
Culture: NO GROWTH
Culture: NO GROWTH
Special Requests: ADEQUATE
Special Requests: ADEQUATE

## 2021-02-21 ENCOUNTER — Inpatient Hospital Stay: Admission: RE | Admit: 2021-02-21 | Payer: Self-pay | Source: Ambulatory Visit

## 2021-04-16 ENCOUNTER — Encounter (INDEPENDENT_AMBULATORY_CARE_PROVIDER_SITE_OTHER): Payer: Self-pay

## 2021-05-08 ENCOUNTER — Other Ambulatory Visit: Payer: Self-pay | Admitting: Family Medicine

## 2021-05-08 DIAGNOSIS — Z1231 Encounter for screening mammogram for malignant neoplasm of breast: Secondary | ICD-10-CM

## 2021-07-01 ENCOUNTER — Ambulatory Visit
Admission: RE | Admit: 2021-07-01 | Discharge: 2021-07-01 | Disposition: A | Discharge status: Home / Self Care | Payer: Medicare Other | Source: Ambulatory Visit | Attending: Family Medicine | Admitting: Family Medicine

## 2021-07-01 ENCOUNTER — Inpatient Hospital Stay: Admission: RE | Admit: 2021-07-01 | Payer: Commercial Managed Care - PPO | Source: Ambulatory Visit

## 2021-07-01 DIAGNOSIS — Z1231 Encounter for screening mammogram for malignant neoplasm of breast: Secondary | ICD-10-CM | POA: Diagnosis present

## 2021-07-09 ENCOUNTER — Inpatient Hospital Stay
Admission: RE | Admit: 2021-07-09 | Discharge: 2021-07-09 | Disposition: A | Discharge status: Home / Self Care | Payer: Self-pay | Source: Ambulatory Visit | Attending: Family Medicine | Admitting: Family Medicine

## 2021-07-09 ENCOUNTER — Inpatient Hospital Stay
Admission: RE | Admit: 2021-07-09 | Discharge: 2021-07-09 | Disposition: A | Discharge status: Home / Self Care | Payer: Self-pay | Source: Ambulatory Visit | Attending: *Deleted | Admitting: *Deleted

## 2021-07-09 ENCOUNTER — Other Ambulatory Visit: Payer: Self-pay | Admitting: Family Medicine

## 2021-07-09 ENCOUNTER — Other Ambulatory Visit: Payer: Self-pay | Admitting: *Deleted

## 2021-07-09 DIAGNOSIS — Z1231 Encounter for screening mammogram for malignant neoplasm of breast: Secondary | ICD-10-CM

## 2022-09-11 ENCOUNTER — Encounter: Payer: Self-pay | Admitting: Emergency Medicine

## 2022-09-11 ENCOUNTER — Emergency Department: Payer: Medicare Other

## 2022-09-11 ENCOUNTER — Ambulatory Visit
Admission: EM | Admit: 2022-09-11 | Discharge: 2022-09-11 | Disposition: A | Discharge status: Home / Self Care | Payer: Medicare Other | Attending: Family Medicine | Admitting: Family Medicine

## 2022-09-11 ENCOUNTER — Ambulatory Visit (INDEPENDENT_AMBULATORY_CARE_PROVIDER_SITE_OTHER): Payer: Medicare Other

## 2022-09-11 ENCOUNTER — Inpatient Hospital Stay
Admission: EM | Admit: 2022-09-11 | Discharge: 2022-09-13 | DRG: 562 | Discharge status: Against Medical Advice | Payer: Medicare Other | Attending: Internal Medicine | Admitting: Internal Medicine

## 2022-09-11 ENCOUNTER — Other Ambulatory Visit: Payer: Self-pay

## 2022-09-11 DIAGNOSIS — Z791 Long term (current) use of non-steroidal anti-inflammatories (NSAID): Secondary | ICD-10-CM

## 2022-09-11 DIAGNOSIS — M1990 Unspecified osteoarthritis, unspecified site: Secondary | ICD-10-CM | POA: Diagnosis present

## 2022-09-11 DIAGNOSIS — F329 Major depressive disorder, single episode, unspecified: Secondary | ICD-10-CM | POA: Diagnosis present

## 2022-09-11 DIAGNOSIS — E440 Moderate protein-calorie malnutrition: Secondary | ICD-10-CM | POA: Diagnosis present

## 2022-09-11 DIAGNOSIS — Z6823 Body mass index (BMI) 23.0-23.9, adult: Secondary | ICD-10-CM

## 2022-09-11 DIAGNOSIS — R7303 Prediabetes: Secondary | ICD-10-CM | POA: Diagnosis present

## 2022-09-11 DIAGNOSIS — Z5329 Procedure and treatment not carried out because of patient's decision for other reasons: Secondary | ICD-10-CM | POA: Diagnosis present

## 2022-09-11 DIAGNOSIS — I10 Essential (primary) hypertension: Secondary | ICD-10-CM | POA: Diagnosis present

## 2022-09-11 DIAGNOSIS — M8580 Other specified disorders of bone density and structure, unspecified site: Secondary | ICD-10-CM | POA: Diagnosis present

## 2022-09-11 DIAGNOSIS — E538 Deficiency of other specified B group vitamins: Secondary | ICD-10-CM | POA: Diagnosis present

## 2022-09-11 DIAGNOSIS — T380X5A Adverse effect of glucocorticoids and synthetic analogues, initial encounter: Secondary | ICD-10-CM | POA: Diagnosis not present

## 2022-09-11 DIAGNOSIS — Z79899 Other long term (current) drug therapy: Secondary | ICD-10-CM

## 2022-09-11 DIAGNOSIS — R296 Repeated falls: Secondary | ICD-10-CM | POA: Diagnosis present

## 2022-09-11 DIAGNOSIS — E785 Hyperlipidemia, unspecified: Secondary | ICD-10-CM | POA: Diagnosis present

## 2022-09-11 DIAGNOSIS — S82851A Displaced trimalleolar fracture of right lower leg, initial encounter for closed fracture: Principal | ICD-10-CM

## 2022-09-11 DIAGNOSIS — S82841A Displaced bimalleolar fracture of right lower leg, initial encounter for closed fracture: Secondary | ICD-10-CM

## 2022-09-11 DIAGNOSIS — S82892A Other fracture of left lower leg, initial encounter for closed fracture: Secondary | ICD-10-CM

## 2022-09-11 DIAGNOSIS — S92022A Displaced fracture of anterior process of left calcaneus, initial encounter for closed fracture: Secondary | ICD-10-CM | POA: Diagnosis present

## 2022-09-11 DIAGNOSIS — Z885 Allergy status to narcotic agent status: Secondary | ICD-10-CM | POA: Diagnosis not present

## 2022-09-11 DIAGNOSIS — S82892S Other fracture of left lower leg, sequela: Secondary | ICD-10-CM | POA: Diagnosis not present

## 2022-09-11 DIAGNOSIS — J309 Allergic rhinitis, unspecified: Secondary | ICD-10-CM | POA: Diagnosis present

## 2022-09-11 DIAGNOSIS — K219 Gastro-esophageal reflux disease without esophagitis: Secondary | ICD-10-CM | POA: Diagnosis present

## 2022-09-11 DIAGNOSIS — J9601 Acute respiratory failure with hypoxia: Secondary | ICD-10-CM | POA: Diagnosis present

## 2022-09-11 DIAGNOSIS — Y92239 Unspecified place in hospital as the place of occurrence of the external cause: Secondary | ICD-10-CM | POA: Diagnosis not present

## 2022-09-11 DIAGNOSIS — W109XXA Fall (on) (from) unspecified stairs and steps, initial encounter: Secondary | ICD-10-CM | POA: Insufficient documentation

## 2022-09-11 DIAGNOSIS — R451 Restlessness and agitation: Secondary | ICD-10-CM | POA: Diagnosis not present

## 2022-09-11 DIAGNOSIS — J441 Chronic obstructive pulmonary disease with (acute) exacerbation: Secondary | ICD-10-CM | POA: Diagnosis present

## 2022-09-11 DIAGNOSIS — S82891A Other fracture of right lower leg, initial encounter for closed fracture: Secondary | ICD-10-CM

## 2022-09-11 DIAGNOSIS — Z87891 Personal history of nicotine dependence: Secondary | ICD-10-CM

## 2022-09-11 DIAGNOSIS — W19XXXA Unspecified fall, initial encounter: Secondary | ICD-10-CM

## 2022-09-11 DIAGNOSIS — R062 Wheezing: Secondary | ICD-10-CM

## 2022-09-11 LAB — CBC WITH DIFFERENTIAL/PLATELET
Abs Immature Granulocytes: 0.02 10*3/uL (ref 0.00–0.07)
Basophils Absolute: 0 10*3/uL (ref 0.0–0.1)
Basophils Relative: 0 %
Eosinophils Absolute: 0.1 10*3/uL (ref 0.0–0.5)
Eosinophils Relative: 1 %
HCT: 34.6 % — ABNORMAL LOW (ref 36.0–46.0)
Hemoglobin: 11.7 g/dL — ABNORMAL LOW (ref 12.0–15.0)
Immature Granulocytes: 0 %
Lymphocytes Relative: 13 %
Lymphs Abs: 1 10*3/uL (ref 0.7–4.0)
MCH: 29.7 pg (ref 26.0–34.0)
MCHC: 33.8 g/dL (ref 30.0–36.0)
MCV: 87.8 fL (ref 80.0–100.0)
Monocytes Absolute: 0.4 10*3/uL (ref 0.1–1.0)
Monocytes Relative: 5 %
Neutro Abs: 6.1 10*3/uL (ref 1.7–7.7)
Neutrophils Relative %: 81 %
Platelets: 172 10*3/uL (ref 150–400)
RBC: 3.94 MIL/uL (ref 3.87–5.11)
RDW: 13.3 % (ref 11.5–15.5)
WBC: 7.6 10*3/uL (ref 4.0–10.5)
nRBC: 0 % (ref 0.0–0.2)

## 2022-09-11 LAB — COMPREHENSIVE METABOLIC PANEL
ALT: 25 U/L (ref 0–44)
AST: 25 U/L (ref 15–41)
Albumin: 3.7 g/dL (ref 3.5–5.0)
Alkaline Phosphatase: 75 U/L (ref 38–126)
Anion gap: 9 (ref 5–15)
BUN: 13 mg/dL (ref 8–23)
CO2: 29 mmol/L (ref 22–32)
Calcium: 8.5 mg/dL — ABNORMAL LOW (ref 8.9–10.3)
Chloride: 100 mmol/L (ref 98–111)
Creatinine, Ser: 0.77 mg/dL (ref 0.44–1.00)
GFR, Estimated: >60 mL/min (ref >60)
Glucose, Bld: 122 mg/dL — ABNORMAL HIGH (ref 70–99)
Potassium: 4.2 mmol/L (ref 3.5–5.1)
Sodium: 138 mmol/L (ref 135–145)
Total Bilirubin: 1 mg/dL (ref 0.3–1.2)
Total Protein: 6.6 g/dL (ref 6.5–8.1)

## 2022-09-11 LAB — SURGICAL PCR SCREEN
MRSA, PCR: NEGATIVE
Staphylococcus aureus: NEGATIVE

## 2022-09-11 LAB — TROPONIN I (HIGH SENSITIVITY)
Troponin I (High Sensitivity): 4 ng/L (ref <18)
Troponin I (High Sensitivity): 5 ng/L (ref <18)

## 2022-09-11 MED ORDER — MONTELUKAST SODIUM 10 MG PO TABS
10.0000 mg | ORAL_TABLET | Freq: Every day | ORAL | Status: DC
  Administered 2022-09-11 – 2022-09-12 (×2): 10 mg via ORAL
  Filled 2022-09-11 (×3): qty 1

## 2022-09-11 MED ORDER — ONDANSETRON HCL 4 MG PO TABS: 4.0000 mg | ORAL_TABLET | Freq: Four times a day (QID) | ORAL | Status: DC | PRN

## 2022-09-11 MED ORDER — OXYCODONE HCL 5 MG PO TABS
5.0000 mg | ORAL_TABLET | Freq: Four times a day (QID) | ORAL | Status: DC | PRN
  Administered 2022-09-11 – 2022-09-13 (×4): 5 mg via ORAL
  Filled 2022-09-11 (×5): qty 1

## 2022-09-11 MED ORDER — ACETAMINOPHEN 325 MG PO TABS
650.0000 mg | ORAL_TABLET | Freq: Four times a day (QID) | ORAL | Status: DC | PRN
  Administered 2022-09-11 – 2022-09-12 (×3): 650 mg via ORAL
  Filled 2022-09-11 (×3): qty 2

## 2022-09-11 MED ORDER — IBUPROFEN 800 MG PO TABS: 800.0000 mg | ORAL_TABLET | Freq: Once | ORAL | Status: DC

## 2022-09-11 MED ORDER — ALBUTEROL SULFATE (2.5 MG/3ML) 0.083% IN NEBU: 2.5000 mg | INHALATION_SOLUTION | RESPIRATORY_TRACT | Status: DC | PRN

## 2022-09-11 MED ORDER — ONDANSETRON HCL 4 MG/2ML IJ SOLN
INTRAMUSCULAR | Status: AC
  Filled 2022-09-11: qty 2

## 2022-09-11 MED ORDER — ONDANSETRON HCL 4 MG/2ML IJ SOLN
INTRAMUSCULAR | Status: AC | PRN
  Administered 2022-09-11: 4 mg via INTRAVENOUS

## 2022-09-11 MED ORDER — MELOXICAM 7.5 MG PO TABS
15.0000 mg | ORAL_TABLET | Freq: Every day | ORAL | Status: DC
  Administered 2022-09-12 – 2022-09-13 (×2): 15 mg via ORAL
  Filled 2022-09-11 (×2): qty 2

## 2022-09-11 MED ORDER — PROPOFOL 10 MG/ML IV BOLUS
1.0000 mg/kg | Freq: Once | INTRAVENOUS | Status: AC
  Administered 2022-09-11: 65.7 mg via INTRAVENOUS
  Filled 2022-09-11: qty 20

## 2022-09-11 MED ORDER — FLUTICASONE FUROATE-VILANTEROL 200-25 MCG/ACT IN AEPB
1.0000 | INHALATION_SPRAY | Freq: Every day | RESPIRATORY_TRACT | Status: DC
  Administered 2022-09-12 – 2022-09-13 (×2): 1 via RESPIRATORY_TRACT
  Filled 2022-09-11: qty 28

## 2022-09-11 MED ORDER — ACETAMINOPHEN 650 MG RE SUPP: 650.0000 mg | Freq: Four times a day (QID) | RECTAL | Status: DC | PRN

## 2022-09-11 MED ORDER — SODIUM CHLORIDE 0.9% FLUSH
3.0000 mL | Freq: Two times a day (BID) | INTRAVENOUS | Status: DC
  Administered 2022-09-11 – 2022-09-13 (×4): 3 mL via INTRAVENOUS

## 2022-09-11 MED ORDER — ONDANSETRON HCL 4 MG/2ML IJ SOLN
4.0000 mg | Freq: Four times a day (QID) | INTRAMUSCULAR | Status: DC | PRN
  Administered 2022-09-11: 4 mg via INTRAVENOUS
  Filled 2022-09-11: qty 2

## 2022-09-11 MED ORDER — AMITRIPTYLINE HCL 25 MG PO TABS
75.0000 mg | ORAL_TABLET | Freq: Every day | ORAL | Status: DC
  Administered 2022-09-11 – 2022-09-12 (×2): 75 mg via ORAL
  Filled 2022-09-11 (×3): qty 3

## 2022-09-11 MED ORDER — ROSUVASTATIN CALCIUM 10 MG PO TABS
10.0000 mg | ORAL_TABLET | Freq: Every day | ORAL | Status: DC
  Administered 2022-09-12 – 2022-09-13 (×2): 10 mg via ORAL
  Filled 2022-09-11: qty 1

## 2022-09-11 MED ORDER — BUDESON-GLYCOPYRROL-FORMOTEROL 160-9-4.8 MCG/ACT IN AERO: 2.0000 | INHALATION_SPRAY | Freq: Two times a day (BID) | RESPIRATORY_TRACT | Status: DC

## 2022-09-11 MED ORDER — POLYETHYLENE GLYCOL 3350 17 G PO PACK: 17.0000 g | PACK | Freq: Every day | ORAL | Status: DC | PRN

## 2022-09-11 MED ORDER — ALBUTEROL SULFATE HFA 108 (90 BASE) MCG/ACT IN AERS: 1.0000 | INHALATION_SPRAY | RESPIRATORY_TRACT | Status: DC | PRN

## 2022-09-11 MED ORDER — HYDROMORPHONE HCL 1 MG/ML IJ SOLN
0.5000 mg | INTRAMUSCULAR | Status: DC | PRN
  Administered 2022-09-11 – 2022-09-12 (×3): 0.5 mg via INTRAVENOUS
  Filled 2022-09-11 (×3): qty 0.5

## 2022-09-11 MED ORDER — UMECLIDINIUM BROMIDE 62.5 MCG/ACT IN AEPB
1.0000 | INHALATION_SPRAY | Freq: Every day | RESPIRATORY_TRACT | Status: DC
  Administered 2022-09-12 – 2022-09-13 (×2): 1 via RESPIRATORY_TRACT
  Filled 2022-09-11: qty 7

## 2022-09-11 MED ORDER — PROPOFOL 10 MG/ML IV BOLUS
INTRAVENOUS | Status: AC | PRN
Start: 2022-09-11 — End: 2022-09-11
  Administered 2022-09-11: 32.85 mg via INTRAVENOUS

## 2022-09-11 MED ORDER — HYDROMORPHONE HCL 1 MG/ML IJ SOLN
1.0000 mg | Freq: Once | INTRAMUSCULAR | Status: AC
  Administered 2022-09-11: 1 mg via INTRAVENOUS
  Filled 2022-09-11: qty 1

## 2022-09-11 NOTE — ED Provider Notes (Signed)
[facility]    CSN: [id number] Arrival date & time: 09/11/22  1041      History   Chief Complaint Chief Complaint  Patient presents with   Fall   Ankle Pain    HPI  HPI Neasha Conneely is a 77 y.o. female.   Marolyn presents after a fall about 30-45 minutes ago. She was walking down the steps to the car so she can get her nails done but missed the last step or two.  She landed on her left hip to a gravel surface. He husband had to help her up as she was not able to stand. She has pain when she moves her ankles. The right one hurts more than the left but they are both swollen and bruised. Denies loss of consciousness and head injury.  She doesn't take any blood thinners.    Past Medical History:  Diagnosis Date   Bronchitis     Patient Active Problem List   Diagnosis Date Noted   CAP (community acquired pneumonia) 02/13/2021    History reviewed. No pertinent surgical history.  OB History   No obstetric history on file.      Home Medications    Prior to Admission medications   Medication Sig Start Date End Date Taking? Authorizing Provider  albuterol (PROVENTIL) (2.5 MG/3ML) 0.083% nebulizer solution Take 3 mLs (2.5 mg total) by nebulization every 2 (two) hours as needed for wheezing. 02/15/21  Yes Darlin Drop, DO  amitriptyline (ELAVIL) 75 MG tablet Take by mouth. 07/11/22  Yes [provider]  Calcium Carb-Cholecalciferol 600-10 MG-MCG TABS Take 1 tablet by mouth 2 (two) times daily with a meal.   Yes [provider]  Cholecalciferol 25 MCG (1000 UT) capsule Take 1 tablet by mouth daily. 06/17/10  Yes [provider]  cyanocobalamin 1000 MCG tablet Take 1,000 mcg by mouth daily.   Yes [provider]  hydrOXYzine (ATARAX) 10 MG tablet Take 10-20 mg by mouth at bedtime. 12/27/20  Yes [provider]  magnesium oxide (MAG-OX) 400 MG tablet Take 1 tablet by mouth daily.   Yes [provider]  meloxicam (MOBIC)  15 MG tablet Take 15 mg by mouth daily. 11/30/20  Yes [provider]  montelukast (SINGULAIR) 10 MG tablet Take 10 mg by mouth daily. 12/14/20  Yes [provider]  Multiple Vitamin (MULTI-VITAMIN) tablet Take 1 tablet by mouth daily.   Yes [provider]  oxybutynin (DITROPAN) 5 MG tablet Take 5 mg by mouth 2 (two) times daily. 12/11/20  Yes [provider]  RABEprazole (ACIPHEX) 20 MG tablet Take 20 mg by mouth daily.   Yes [provider]  rosuvastatin (CRESTOR) 10 MG tablet Take 10 mg by mouth daily. 12/11/20  Yes [provider]    Family History History reviewed. No pertinent family history.  Social History Social History   Tobacco Use   Smoking status: Former    Types: Cigarettes   Smokeless tobacco: Never  Substance Use Topics   Alcohol use: Not Currently   Drug use: Never     Allergies   Codeine   Review of Systems Review of Systems: :negative unless otherwise stated in HPI.      Physical Exam Triage Vital Signs ED Triage Vitals  Encounter Vitals Group     BP --      Systolic BP Percentile --      Diastolic BP Percentile --      Pulse --  Resp 09/11/22 1049 16     Temp --      Temp Source 09/11/22 1049 Oral     SpO2 --      Weight 09/11/22 1048 145 lb (65.8 kg)     Height 09/11/22 1048 5\' 6"  (1.676 m)     Head Circumference --      Peak Flow --      Pain Score 09/11/22 1052 10     Pain Loc --      Pain Education --      Exclude from Growth Chart --    No data found.  Updated Vital Signs BP (!) 153/50 (BP Location: Right Arm)   Pulse 87   Temp 98.2 F (36.8 C) (Oral)   Resp 16   Ht 5\' 6"  (1.676 m)   Wt 65.8 kg   SpO2 94%   BMI 23.40 kg/m   Visual Acuity Right Eye Distance:   Left Eye Distance:   Bilateral Distance:    Right Eye Near:   Left Eye Near:    Bilateral Near:     Physical Exam GEN: well appearing female in no acute distress  CVS: well perfused  RESP: speaking in  full sentences without pause, no respiratory distress  MSK:   Ankle/Foot, bilateral: deformity to right ankle with moderate edema and ecchymosis, ER of right foot, able to move most of her toes,  left ankle ecchymosis and mild edema, right anterior fibula TTP, strong DP pulses bilaterally - ROM and strength testing deferred - Unable to bear weight    UC Treatments / Results  Labs (all labs ordered are listed, but only abnormal results are displayed) Labs Reviewed - No data to display  EKG   Radiology DG Ankle Complete Right  Result Date: 09/11/2022 CLINICAL DATA:  Post fall, now with right ankle pain. EXAM: RIGHT ANKLE - COMPLETE 3+ VIEW COMPARISON:  None Available. FINDINGS: There is a acute, comminuted, displaced trimalleolar ankle fracture with disruption of the ankle mortise, apex anteromedially. Expected adjacent soft tissue swelling. No radiopaque foreign body. Tiny plantar calcaneal spur. Enthesopathic change involving the Achilles tendon insertion site. IMPRESSION: Acute, comminuted, displaced trimalleolar ankle fracture with disruption of the ankle mortise. Electronically Signed   By: Simonne Come M.D.   On: 09/11/2022 12:24   DG Ankle Complete Left  Result Date: 09/11/2022 CLINICAL DATA:  Post fall, now with left ankle pain. EXAM: LEFT ANKLE COMPLETE - 3+ VIEW COMPARISON:  None Available. FINDINGS: Soft tissue swelling about the lateral malleolus this finding is associated with an apparent avulsion fracture involving the lateral aspect of the presumed calcaneus, only seen on the provided AP radiograph. Joint spaces are preserved. Note is made of a small ankle joint effusion. Tiny plantar calcaneal spur. Enthesopathic change involving the Achilles tendon insertion site. IMPRESSION: Suspected acute avulsion fracture involving the lateral aspect of the presumed calcaneus, only seen on the provided AP radiograph. Further evaluation with ankle CT could be performed as indicated.  Electronically Signed   By: Simonne Come M.D.   On: 09/11/2022 12:22     Procedures Procedures (including critical care time)  Medications Ordered in UC Medications - No data to display  Initial Impression / Assessment and Plan / UC Course  I have reviewed the triage vital signs and the nursing notes.  Pertinent labs & imaging results that were available during my care of the patient were reviewed by me and considered in my medical decision making (see chart for details).  Pt is a 77 y.o.  female after acute fall at home about an hour ago. On exam, pt has right foot/ankle deformity and diffuse pain and left foot/ankle pain concerning for fracture. She declined IM pain control. No oral medication given as she likely needs surgery soon.   Obtained  plain films.  Personally interpreted by me were remarkable for what appears to be a trimalleolar fracture on the right and likely lateral avulsion fracture on the left. Radiologist report reviewed and confirms trimalleolar fracture notes presumed calcaneus avulsion fracture. Discussed need for ED evaluation for orthopedic surgery evaluation and pain control.  Pt is not able to bear weight therefore recommended EMS transport and she and her husband are agreeable.    EMS updated upon arrival.  Pt to be transported to [facility].  ED charge RN updated on patient's arrival via EMS.    Discussed MDM, treatment plan and plan for follow-up with patient and her husband who agree with plan.   Final Clinical Impressions(s) / UC Diagnoses   Final diagnoses:  Closed avulsion fracture of left ankle, initial encounter  Closed trimalleolar fracture of right ankle, initial encounter     Discharge Instructions      You have fractures and are being transitioned to the emergency department for fracture management and pain control.  I suspect you will need surgery.       ED Prescriptions   None    PDMP not reviewed this encounter.   Katha Cabal,  DO 09/11/22 1454

## 2022-09-11 NOTE — ED Provider Notes (Signed)
[facility] Provider Note   Event Date/Time   First MD Initiated Contact with Patient 09/11/22 1317     (approximate) History  Fall  HPI Deborah Herrera is a 77 y.o. female with a past medical history of hyperlipidemia and hypertension who presents after mechanical fall from standing on her driveway and potentially getting her foot caught underneath a stair.  Patient was seen at urgent care previously and found to have fractured bilateral ankles.  Patient denies any loss of consciousness.  Patient denies any preceding symptoms such as palpitations or lightheadedness.  Patient was given 50 mcg of fentanyl en route with limited pain control.  Patient currently complains of 6/10 right ankle pain that radiates up the right lower extremity ROS: Patient currently denies any vision changes, tinnitus, difficulty speaking, facial droop, sore throat, chest pain, shortness of breath, abdominal pain, nausea/vomiting/diarrhea, dysuria, or weakness/numbness/paresthesias in any extremity   Physical Exam  Triage Vital Signs: ED Triage Vitals  Encounter Vitals Group     BP 09/11/22 1315 (!) 145/74     Systolic BP Percentile --      Diastolic BP Percentile --      Pulse Rate 09/11/22 1315 78     Resp 09/11/22 1315 16     Temp 09/11/22 1315 98 F (36.7 C)     Temp Source 09/11/22 1315 Oral     SpO2 09/11/22 1315 96 %     Weight 09/11/22 1313 144 lb 13.5 oz (65.7 kg)     Height 09/11/22 1313 5\' 6"  (1.676 m)     Head Circumference --      Peak Flow --      Pain Score 09/11/22 1313 6     Pain Loc --      Pain Education --      Exclude from Growth Chart --    Most recent vital signs: Vitals:   09/11/22 1510 09/11/22 1515  BP: (!) 140/59 (!) 142/72  Pulse: 100 92  Resp: 11 10  Temp:    SpO2: 90% 92%   General: Awake, oriented x4. CV:  Good peripheral perfusion.  Resp:  Normal effort.  Abd:  No distention.  Other:  Deformity to right ankle w/ surrounding bruising, bruising  noted to lateral left ankle ED Results / Procedures / Treatments  Labs (all labs ordered are listed, but only abnormal results are displayed) Labs Reviewed  COMPREHENSIVE METABOLIC PANEL - Abnormal; Notable for the following components:      Result Value   Glucose, Bld 122 (*)    Calcium 8.5 (*)    All other components within normal limits  CBC WITH DIFFERENTIAL/PLATELET - Abnormal; Notable for the following components:   Hemoglobin 11.7 (*)    HCT 34.6 (*)    All other components within normal limits  HEMOGLOBIN A1C  TROPONIN I (HIGH SENSITIVITY)    RADIOLOGY ED MD interpretation: X-ray of bilateral ankles show a acute avulsion fracture involving the lateral aspect of the presumed calcaneus seen on the AP photographed on the left ankle.  On the right ankle there is an acute comminuted displaced trimalleolar fracture -Agree with radiology assessment Official radiology report(s): DG Ankle 2 Views Right  Result Date: 09/11/2022 CLINICAL DATA:  Dislocation reduction. EXAM: RIGHT ANKLE - 2 VIEW COMPARISON:  Right ankle radiographs 09/11/2022, earlier same day. FINDINGS: Interval reduction of the distal tibia with the talar dome. Redemonstration of vertically oriented fracture of the posterior malleolus, with improved alignment. Minimally displaced fracture  of the medial malleolus, predominantly with vertical oriented fracture at the junction with the more proximal tibia. Improved alignment of the distal fibular metadiaphyseal fracture with approximately 3 mm cortical step-off medially on frontal view. Moderate lateral malleolar soft tissue swelling. Mild right plantar calcaneal heel spur. IMPRESSION: 1. Interval reduction of the distal tibia with the talar dome. 2. Improved alignment of the trimalleolar fractures. Electronically Signed   By: Neita Garnet M.D.   On: 09/11/2022 15:25   DG Ankle Complete Right  Result Date: 09/11/2022 CLINICAL DATA:  Post fall, now with right ankle pain. EXAM:  RIGHT ANKLE - COMPLETE 3+ VIEW COMPARISON:  None Available. FINDINGS: There is a acute, comminuted, displaced trimalleolar ankle fracture with disruption of the ankle mortise, apex anteromedially. Expected adjacent soft tissue swelling. No radiopaque foreign body. Tiny plantar calcaneal spur. Enthesopathic change involving the Achilles tendon insertion site. IMPRESSION: Acute, comminuted, displaced trimalleolar ankle fracture with disruption of the ankle mortise. Electronically Signed   By: Simonne Come M.D.   On: 09/11/2022 12:24   DG Ankle Complete Left  Result Date: 09/11/2022 CLINICAL DATA:  Post fall, now with left ankle pain. EXAM: LEFT ANKLE COMPLETE - 3+ VIEW COMPARISON:  None Available. FINDINGS: Soft tissue swelling about the lateral malleolus this finding is associated with an apparent avulsion fracture involving the lateral aspect of the presumed calcaneus, only seen on the provided AP radiograph. Joint spaces are preserved. Note is made of a small ankle joint effusion. Tiny plantar calcaneal spur. Enthesopathic change involving the Achilles tendon insertion site. IMPRESSION: Suspected acute avulsion fracture involving the lateral aspect of the presumed calcaneus, only seen on the provided AP radiograph. Further evaluation with ankle CT could be performed as indicated. Electronically Signed   By: Simonne Come M.D.   On: 09/11/2022 12:22   PROCEDURES: Critical Care performed: No Procedures MEDICATIONS ORDERED IN ED: Medications  ondansetron (ZOFRAN) 4 MG/2ML injection (has no administration in time range)  sodium chloride flush (NS) 0.9 % injection 3 mL (has no administration in time range)  acetaminophen (TYLENOL) tablet 650 mg (has no administration in time range)    Or  acetaminophen (TYLENOL) suppository 650 mg (has no administration in time range)  oxyCODONE (Oxy IR/ROXICODONE) immediate release tablet 5 mg (has no administration in time range)  HYDROmorphone (DILAUDID) injection 0.5 mg  (has no administration in time range)  polyethylene glycol (MIRALAX / GLYCOLAX) packet 17 g (has no administration in time range)  ondansetron (ZOFRAN) tablet 4 mg (has no administration in time range)    Or  ondansetron (ZOFRAN) injection 4 mg (has no administration in time range)  HYDROmorphone (DILAUDID) injection 1 mg (1 mg Intravenous Given 09/11/22 1359)  propofol (DIPRIVAN) 10 mg/mL bolus/IV push 65.7 mg (65.7 mg Intravenous Given 09/11/22 1523)  ondansetron (ZOFRAN) injection (4 mg Intravenous Given 09/11/22 1452)  propofol (DIPRIVAN) 10 mg/mL bolus/IV push (32.85 mg Intravenous Given 09/11/22 1453)   IMPRESSION / MDM / ASSESSMENT AND PLAN / ED COURSE  I reviewed the triage vital signs and the nursing notes.                             The patient is on the cardiac monitor to evaluate for evidence of arrhythmia and/or significant heart rate changes. Patient's presentation is most consistent with acute presentation with potential threat to life or bodily function. The Pt was found to have a closed right ankle trimalleolar and left lateral avulsion  fracture on XR. The Pt is otherwise well appearing, hemodynamically stable, and shows no evidence of neurovascular injury or compartment syndrome.  I have low suspicion for dislocation, significant ligamentous injury, septic arthritis, gout flare, new autoimmune arthropathy, or gonococcal arthropathy. Patient was placed in posterior short and sugar-tong right lower extremity splint after dislocation reduction under sedation and will follow up with ortho.    Dispo: Admit to medicine   FINAL CLINICAL IMPRESSION(S) / ED DIAGNOSES   Final diagnoses:  Trimalleolar fracture, right, closed, initial encounter  Fall, initial encounter  Closed avulsion fracture of left ankle, initial encounter   Rx / DC Orders   ED Discharge Orders     None      Note:  This document was prepared using Dragon voice recognition software and may include unintentional  dictation errors.   Merwyn Katos, MD 09/11/22 [phone]

## 2022-09-11 NOTE — Assessment & Plan Note (Signed)
On examination, patient had some mild intermittent wheezing.  Given previous tobacco use and episodes of frequent bronchitis in the past, I do have a strong suspicion for underlying COPD.  Patient states she uses albuterol daily.  - Recommend outpatient referral for PFTs - Continue home albuterol

## 2022-09-11 NOTE — ED Notes (Signed)
Difficulty obtained accurate blood pressure on patient at this time.

## 2022-09-11 NOTE — Sedation Documentation (Addendum)
After propofol was administered pt experienced hypoxia. Pt SpO2 dropped to 61% on 2 liters. Dr. Vicente Males performed Jaw thrust and patient was bagged by Bonita Quin, RN. Pts SpO2 started to improve. Pts ankle was reduced and x-ray called to perform stat portable xray.

## 2022-09-11 NOTE — ED Notes (Addendum)
Patient is being discharged from the Urgent Care and sent to the Emergency Department via EMS . Per Dr.Brimage, patient is in need of higher level of care due to bilateral ankle fx & unable to bear wt . Patient is aware and verbalizes understanding of plan of care.  Vitals:   09/11/22 1049  BP: (!) 153/50  Pulse: 87  Resp: 16  Temp: 98.2 F (36.8 C)  SpO2: 94%

## 2022-09-11 NOTE — ED Notes (Signed)
Still having difficulty obtaining an accurate blood pressure on patient at this time.

## 2022-09-11 NOTE — ED Triage Notes (Signed)
Pt c/o bilateral ankle pain due fall this AM. States she was going down the steps & missed a step.

## 2022-09-11 NOTE — ED Notes (Signed)
Back from CT scan    Husband at bedside

## 2022-09-11 NOTE — Assessment & Plan Note (Addendum)
Patient is presenting with bilateral ankle fractures after mechanical fall down the bottom of the stairs.  She has undergone reduction of displaced trimalleolar fracture with improved alignment.  - Podiatry consulted; appreciate their recommendations - Plan for the OR either tomorrow or the day after per podiatry - Regular diet - N.p.o. after midnight - Tylenol, oxycodone and Dilaudid for pain control

## 2022-09-11 NOTE — Plan of Care (Signed)

## 2022-09-11 NOTE — Assessment & Plan Note (Signed)
A1c of 6.1% approximately 8 months ago.  - A1c pending - No indication for SSI

## 2022-09-11 NOTE — Assessment & Plan Note (Addendum)
Patient's fall was mechanical in nature with no prodromal symptoms including dizziness, chest pain, shortness of breath.  Unfortunately, she did fall down approximately 4-5 stairs.  No head injuries.  - PT/OT after surgery

## 2022-09-11 NOTE — Sedation Documentation (Signed)
Pt became nauseated and vomited x 1. Verbal order for Zofran 4 mg IVP given by Dr. Vicente Males.

## 2022-09-11 NOTE — Sedation Documentation (Signed)
John EDT at bedside

## 2022-09-11 NOTE — Discharge Instructions (Addendum)
You have fractures and are being transitioned to the emergency department for fracture management and pain control.  I suspect you will need surgery.

## 2022-09-11 NOTE — H&P (Signed)
History and Physical    Patient: Deborah Herrera YSA:[id number] DOB: [birth year] DOA: 09/11/2022 DOS: the patient was seen and examined on 09/11/2022 PCP: Alm Bustard, NP  Patient coming from: Home  Chief Complaint:  Chief Complaint  Patient presents with   Fall   HPI: Deborah Herrera is a 77 y.o. female with medical history significant of hyperlipidemia, prediabetes, osteoarthritis, B12 deficiency, osteopenia, tobacco use, presents to the ED due to ground-level fall.   Deborah Herrera states she was on her way to getting her nails done when she lost her footing at the top of her stairs.  She fell down approximately 4-5 steps.  She denies any dizziness, chest pain, palpitations or shortness of breath prior to the fall, stating she felt good and at her baseline.  After falling, she noticed significant bilateral ankle pain.  She denies any other pain at this time. She denies loss of consciousness or hitting her head.  She is not on any blood thinners.  ED course: On arrival to the ED, patient was hypertensive at 145/74 with heart rate of 78.  She was saturating at 96% on room air.  She was afebrile at 98. Initial workup notable for acute displaced trimalleolar ankle fracture on the right, and acute avulsion fracture on the left.  Patient underwent right ankle reduction with improved alignment.  Podiatry was consulted with plans for the OR.  [facility] contacted for admission.  Review of Systems: As mentioned in the history of present illness. All other systems reviewed and are negative.  Past Medical History:  Diagnosis Date   Bronchitis    History reviewed. No pertinent surgical history. Social History:  reports that she has quit smoking. Her smoking use included cigarettes. She has never used smokeless tobacco. She reports that she does not currently use alcohol. She reports that she does not use drugs.  Allergies  Allergen Reactions   Codeine Nausea And Vomiting and Nausea Only    History reviewed.  No pertinent family history.  Prior to Admission medications   Medication Sig Start Date End Date Taking? Authorizing Provider  albuterol (PROVENTIL) (2.5 MG/3ML) 0.083% nebulizer solution Take 3 mLs (2.5 mg total) by nebulization every 2 (two) hours as needed for wheezing. 02/15/21   Darlin Drop, DO  amitriptyline (ELAVIL) 75 MG tablet Take by mouth. 07/11/22   [provider]  Calcium Carb-Cholecalciferol 600-10 MG-MCG TABS Take 1 tablet by mouth 2 (two) times daily with a meal.    [provider]  Cholecalciferol 25 MCG (1000 UT) capsule Take 1 tablet by mouth daily. 06/17/10   [provider]  cyanocobalamin 1000 MCG tablet Take 1,000 mcg by mouth daily.    [provider]  hydrOXYzine (ATARAX) 10 MG tablet Take 10-20 mg by mouth at bedtime. 12/27/20   [provider]  magnesium oxide (MAG-OX) 400 MG tablet Take 1 tablet by mouth daily.    [provider]  meloxicam (MOBIC) 15 MG tablet Take 15 mg by mouth daily. 11/30/20   [provider]  montelukast (SINGULAIR) 10 MG tablet Take 10 mg by mouth daily. 12/14/20   [provider]  Multiple Vitamin (MULTI-VITAMIN) tablet Take 1 tablet by mouth daily.    [provider]  oxybutynin (DITROPAN) 5 MG tablet Take 5 mg by mouth 2 (two) times daily. 12/11/20   [provider]  RABEprazole (ACIPHEX) 20 MG tablet Take 20 mg by mouth daily.    [provider]  rosuvastatin (CRESTOR) 10 MG  tablet Take 10 mg by mouth daily. 12/11/20   [provider]    Physical Exam: Vitals:   09/11/22 1503 09/11/22 1505 09/11/22 1510 09/11/22 1515  BP: 139/70 (!) 98/42 (!) 140/59 (!) 142/72  Pulse: (!) 101 99 100 92  Resp: 15 18 11 10   Temp:      TempSrc:      SpO2: (!) 89% (!) 89% 90% 92%  Weight:      Height:       Physical Exam Vitals and nursing note reviewed.  Constitutional:      General: She is not in acute distress.    Appearance: She is normal  weight.  HENT:     Mouth/Throat:     Mouth: Mucous membranes are moist.     Pharynx: Oropharynx is clear.  Eyes:     Conjunctiva/sclera: Conjunctivae normal.     Pupils: Pupils are equal, round, and reactive to light.  Cardiovascular:     Rate and Rhythm: Normal rate and regular rhythm.     Heart sounds: No murmur heard.    No gallop.  Pulmonary:     Effort: Pulmonary effort is normal. No respiratory distress.     Breath sounds: Wheezing (Mild intermittent expiratory wheezing) present. No rhonchi or rales.  Abdominal:     General: Bowel sounds are normal.     Palpations: Abdomen is soft.  Musculoskeletal:     Comments: Right foot wrapped up with Ace bandage up to the knee.  Left foot with significant bruising on the medial and dorsal aspect with tenderness to palpation.  Skin:    General: Skin is warm and dry.  Neurological:     Mental Status: She is alert and oriented to person, place, and time. Mental status is at baseline.  Psychiatric:        Mood and Affect: Mood normal.        Behavior: Behavior normal.    Data Reviewed: CBC, CMP and troponin are still pending  EKG still pending  DG Ankle 2 Views Right  Result Date: 09/11/2022 CLINICAL DATA:  Dislocation reduction. EXAM: RIGHT ANKLE - 2 VIEW COMPARISON:  Right ankle radiographs 09/11/2022, earlier same day. FINDINGS: Interval reduction of the distal tibia with the talar dome. Redemonstration of vertically oriented fracture of the posterior malleolus, with improved alignment. Minimally displaced fracture of the medial malleolus, predominantly with vertical oriented fracture at the junction with the more proximal tibia. Improved alignment of the distal fibular metadiaphyseal fracture with approximately 3 mm cortical step-off medially on frontal view. Moderate lateral malleolar soft tissue swelling. Mild right plantar calcaneal heel spur. IMPRESSION: 1. Interval reduction of the distal tibia with the talar dome. 2. Improved  alignment of the trimalleolar fractures. Electronically Signed   By: Neita Garnet M.D.   On: 09/11/2022 15:25   DG Ankle Complete Right  Result Date: 09/11/2022 CLINICAL DATA:  Post fall, now with right ankle pain. EXAM: RIGHT ANKLE - COMPLETE 3+ VIEW COMPARISON:  None Available. FINDINGS: There is a acute, comminuted, displaced trimalleolar ankle fracture with disruption of the ankle mortise, apex anteromedially. Expected adjacent soft tissue swelling. No radiopaque foreign body. Tiny plantar calcaneal spur. Enthesopathic change involving the Achilles tendon insertion site. IMPRESSION: Acute, comminuted, displaced trimalleolar ankle fracture with disruption of the ankle mortise. Electronically Signed   By: Simonne Come M.D.   On: 09/11/2022 12:24   DG Ankle Complete Left  Result Date: 09/11/2022 CLINICAL DATA:  Post fall, now with left ankle pain.  EXAM: LEFT ANKLE COMPLETE - 3+ VIEW COMPARISON:  None Available. FINDINGS: Soft tissue swelling about the lateral malleolus this finding is associated with an apparent avulsion fracture involving the lateral aspect of the presumed calcaneus, only seen on the provided AP radiograph. Joint spaces are preserved. Note is made of a small ankle joint effusion. Tiny plantar calcaneal spur. Enthesopathic change involving the Achilles tendon insertion site. IMPRESSION: Suspected acute avulsion fracture involving the lateral aspect of the presumed calcaneus, only seen on the provided AP radiograph. Further evaluation with ankle CT could be performed as indicated. Electronically Signed   By: Simonne Come M.D.   On: 09/11/2022 12:22    Results are pending, will review when available.  Assessment and Plan:  * Bilateral ankle fractures Patient is presenting with bilateral ankle fractures after mechanical fall down the bottom of the stairs.  She has undergone reduction of displaced trimalleolar fracture with improved alignment.  - Podiatry consulted; appreciate their  recommendations - Plan for the OR either tomorrow or the day after per podiatry - Regular diet - N.p.o. after midnight - Tylenol, oxycodone and Dilaudid for pain control  Fall from stairs Patient's fall was mechanical in nature with no prodromal symptoms including dizziness, chest pain, shortness of breath.  Unfortunately, she did fall down approximately 4-5 stairs.  No head injuries.  - PT/OT after surgery  Prediabetes A1c of 6.1% approximately 8 months ago.  - A1c pending - No indication for SSI  Wheezing On examination, patient had some mild intermittent wheezing.  Given previous tobacco use and episodes of frequent bronchitis in the past, I do have a strong suspicion for underlying COPD.  Patient states she uses albuterol daily.  - Recommend outpatient referral for PFTs - Continue home albuterol  Advance Care Planning:   Code Status: Full Code verified by patient  Consults: Podiatry  Family Communication: Patient's husband updated at bedside.  Severity of Illness: The appropriate patient status for this patient is INPATIENT. Inpatient status is judged to be reasonable and necessary in order to provide the required intensity of service to ensure the patient's safety. The patient's presenting symptoms, physical exam findings, and initial radiographic and laboratory data in the context of their chronic comorbidities is felt to place them at high risk for further clinical deterioration. Furthermore, it is not anticipated that the patient will be medically stable for discharge from the hospital within 2 midnights of admission.   * I certify that at the point of admission it is my clinical judgment that the patient will require inpatient hospital care spanning beyond 2 midnights from the point of admission due to high intensity of service, high risk for further deterioration and high frequency of surveillance required.*  Author: Verdene Lennert, MD 09/11/2022 4:46 PM  For on call  review [email or web address].

## 2022-09-11 NOTE — ED Triage Notes (Signed)
Presents from [facility] s/p fall this am   States she tripped and fell at home  Twisted right foot and landed on the left  Bruising and swelling noted

## 2022-09-12 ENCOUNTER — Inpatient Hospital Stay: Payer: Medicare Other

## 2022-09-12 DIAGNOSIS — S82892S Other fracture of left lower leg, sequela: Secondary | ICD-10-CM | POA: Diagnosis not present

## 2022-09-12 DIAGNOSIS — S82851A Displaced trimalleolar fracture of right lower leg, initial encounter for closed fracture: Secondary | ICD-10-CM

## 2022-09-12 MED ORDER — IPRATROPIUM-ALBUTEROL 0.5-2.5 (3) MG/3ML IN SOLN
3.0000 mL | Freq: Four times a day (QID) | RESPIRATORY_TRACT | Status: DC
  Administered 2022-09-12: 3 mL via RESPIRATORY_TRACT
  Filled 2022-09-12: qty 3

## 2022-09-12 MED ORDER — OYSTER SHELL CALCIUM/D3 500-5 MG-MCG PO TABS
1.0000 | ORAL_TABLET | Freq: Two times a day (BID) | ORAL | Status: DC
  Administered 2022-09-12 – 2022-09-13 (×3): 1 via ORAL
  Filled 2022-09-12 (×4): qty 1

## 2022-09-12 MED ORDER — LACTATED RINGERS IV SOLN: INTRAVENOUS | Status: AC

## 2022-09-12 MED ORDER — OXYBUTYNIN CHLORIDE 5 MG PO TABS
5.0000 mg | ORAL_TABLET | Freq: Two times a day (BID) | ORAL | Status: DC
  Administered 2022-09-12 – 2022-09-13 (×3): 5 mg via ORAL
  Filled 2022-09-12 (×4): qty 1

## 2022-09-12 MED ORDER — ENOXAPARIN SODIUM 40 MG/0.4ML IJ SOSY
40.0000 mg | PREFILLED_SYRINGE | INTRAMUSCULAR | Status: DC
  Administered 2022-09-12: 40 mg via SUBCUTANEOUS
  Filled 2022-09-12 (×2): qty 0.4

## 2022-09-12 MED ORDER — IPRATROPIUM-ALBUTEROL 0.5-2.5 (3) MG/3ML IN SOLN
3.0000 mL | Freq: Three times a day (TID) | RESPIRATORY_TRACT | Status: DC
  Administered 2022-09-12 – 2022-09-13 (×2): 3 mL via RESPIRATORY_TRACT
  Filled 2022-09-12 (×2): qty 3

## 2022-09-12 MED ORDER — HALOPERIDOL LACTATE 5 MG/ML IJ SOLN
2.5000 mg | Freq: Once | INTRAMUSCULAR | Status: DC
  Filled 2022-09-12: qty 1

## 2022-09-12 MED ORDER — PANTOPRAZOLE SODIUM 40 MG PO TBEC
40.0000 mg | DELAYED_RELEASE_TABLET | Freq: Every day | ORAL | Status: DC
  Administered 2022-09-12 – 2022-09-13 (×2): 40 mg via ORAL
  Filled 2022-09-12 (×2): qty 1

## 2022-09-12 MED ORDER — PREDNISONE 20 MG PO TABS
40.0000 mg | ORAL_TABLET | Freq: Every day | ORAL | Status: DC
  Administered 2022-09-12 – 2022-09-13 (×2): 40 mg via ORAL
  Filled 2022-09-12 (×2): qty 2

## 2022-09-12 NOTE — Progress Notes (Signed)
Progress Note   Patient: Deborah Herrera NUU:[id number] DOB: 11-09-1945 DOA: 09/11/2022     1 DOS: the patient was seen and examined on 09/12/2022   Brief hospital course: No notes on file  Assessment and Plan: * Right trimalleolar fracture s/p mechanical fall: Patient is presenting with bilateral ankle fractures after mechanical fall down the bottom of the stairs.  She has undergone reduction of displaced trimalleolar fracture with improved alignment.  - Podiatry consulted; appreciate their recommendations - Plan for the OR either tomorrow or the day after per podiatry - Regular diet - N.p.o. after midnight - Tylenol, oxycodone and Dilaudid for pain control  - Ortho plans right ankle ORIF on Monday 8/5.  Keep NPO on Sunday at midnight.  Appreciate assistance and recommendations.  Fall from stairs Patient's fall was mechanical in nature with no prodromal symptoms including dizziness, chest pain, shortness of breath.  Unfortunately, she did fall down approximately 4-5 stairs.  No head injuries.  - PT/OT will be consulted after surgery.  Prediabetes A1c of 6.1% approximately 8 months ago.  - A1c pending - No indication for SSI  Acute Hypoxic Respiratory Failure: - Patient is on 2L [place].  Wean oxygen as tolerated.  Acute COPD Exacerbation: - Start Prednisone 40 mg daily x 5 days. - Start Duo-Nebs q6h. - No need for antibiotics.        Subjective:  Patient has wheezing and mild shortness of breath.  We will start short course of steroids.  Family states patient gets a little agitated with steroids.  We will have Haldol available PRN. She endorses right foot pain.  Plan of care was discussed with Ortho who plan ORIF on Monday 8/5.  Physical Exam: Vitals:   09/12/22 1000 09/12/22 1005 09/12/22 1409 09/12/22 1700  BP:    (!) 131/54  Pulse:    (!) 103  Resp:    18  Temp:    98.1 F (36.7 C)  TempSrc:    Oral  SpO2: (!) 88% 95% 97% 90%  Weight:      Height:       Physical  Exam Constitutional:      General: She is in acute distress.  HENT:     Head: Normocephalic and atraumatic.     Mouth/Throat:     Mouth: Mucous membranes are moist.  Eyes:     Extraocular Movements: Extraocular movements intact.     Pupils: Pupils are equal, round, and reactive to light.  Cardiovascular:     Rate and Rhythm: Normal rate and regular rhythm.     Pulses: Normal pulses.     Heart sounds: Normal heart sounds.  Pulmonary:     Effort: Pulmonary effort is normal.     Breath sounds: Wheezing present.  Abdominal:     General: There is no distension.     Palpations: Abdomen is soft.     Tenderness: There is no abdominal tenderness.  Musculoskeletal:     Cervical back: Normal range of motion and neck supple.  Skin:    General: Skin is warm and dry.     Capillary Refill: Capillary refill takes less than 2 seconds.  Neurological:     General: No focal deficit present.  Psychiatric:        Mood and Affect: Mood normal.     Data Reviewed: Lab Results  Component Value Date   WBC 6.5 09/12/2022   HGB 11.1 (L) 09/12/2022   HCT 33.7 (L) 09/12/2022   MCV 89.2 09/12/2022  PLT 166 09/12/2022   Last metabolic panel Lab Results  Component Value Date   GLUCOSE 104 (H) 09/12/2022   NA 134 (L) 09/12/2022   K 4.4 09/12/2022   CL 98 09/12/2022   CO2 30 09/12/2022   BUN 14 09/12/2022   CREATININE 0.84 09/12/2022   GFRNONAA >60 09/12/2022   CALCIUM 8.5 (L) 09/12/2022   PROT 6.6 09/11/2022   ALBUMIN 3.7 09/11/2022   BILITOT 1.0 09/11/2022   ALKPHOS 75 09/11/2022   AST 25 09/11/2022   ALT 25 09/11/2022   ANIONGAP 6 09/12/2022     Family Communication: Discussed with family at bedside.  Disposition: Status is: Inpatient Remains inpatient appropriate because: pending 8/5 ORIF  Planned Discharge Destination: PT/OT evaluation pending.    Time spent: >60 minutes  Author: Baldwin Jamaica, MD 09/12/2022 8:04 PM  For on call review [email or web address].

## 2022-09-12 NOTE — Consult Note (Signed)
  Subjective:  Patient ID: Deborah Herrera, female    DOB: 1946-02-07,  MRN: [id number]  Patient with past medical history of HLD, prediabetes, osteoarthritis, B12 deficnecny, osteopenia, tobacco use seen at beside today for a fall from ground level. Seen today at bedside with several family members. Daughter relates a fall out of her door down  a couple steps. She was brought the ED for evaluation.   Past Medical History:  Diagnosis Date   Bronchitis      History reviewed. No pertinent surgical history.     Latest Ref Rng & Units 09/12/2022    5:49 AM 09/11/2022    3:16 PM 02/14/2021    6:50 AM  CBC  WBC 4.0 - 10.5 K/uL 6.5  7.6  9.1   Hemoglobin 12.0 - 15.0 g/dL 40.9  81.1  91.4   Hematocrit 36.0 - 46.0 % 33.7  34.6  30.8   Platelets 150 - 400 K/uL 166  172  178        Latest Ref Rng & Units 09/12/2022    5:49 AM 09/11/2022    3:16 PM 02/14/2021    6:50 AM  BMP  Glucose 70 - 99 mg/dL 782  956  213   BUN 8 - 23 mg/dL 14  13  18    Creatinine 0.44 - 1.00 mg/dL 0.86  5.78  4.69   Sodium 135 - 145 mmol/L 134  138  138   Potassium 3.5 - 5.1 mmol/L 4.4  4.2  3.7   Chloride 98 - 111 mmol/L 98  100  100   CO2 22 - 32 mmol/L 30  29  26    Calcium 8.9 - 10.3 mg/dL 8.5  8.5  8.5      Objective:   Vitals:   09/12/22 1005 09/12/22 1409  BP:    Pulse:    Resp:    Temp:    SpO2: 95% 97%    General:AA&O x 3. Normal mood and affect   Vascular: DP and PT pulses 2/4 bilateral. Brisk capillary refill to all digits. Pedal hair present   Neruological. Epicritic sensation grossly intact.   Derm: Erythema and edema noted to bilateral lower extremities. Eccymosis noted to dorsum of foot bilateral. Able to wiggle toes.   MSK: MMT 5/5 in dorsiflexion, plantar flexion, inversion and eversion. Normal joint ROM without pain or crepitus.        Assessment & Plan:  Patient was evaluated and treated and all questions answered.  DX: Right tri-malleolar fracture, left anterior process of calcaneus  fracture.  NWB to bilateral lower extremities. Splint on right foot.  Discussed with patient diagnosis and treatment options.  Imaging reviewed. CT and imaging showing acute comminuted fracture of lateral anterior process of calcaneus on right and on left displaced ankle tri-malleolar fracture  Discussed patient will need surgery for the right ankle for ORIF. Will plan for surgery on Monday  8/5 with Dr. Allena Katz  Patient in agreement with plan and all questions answered.  Patient to be NPO after midnight on Sunday evening.   Louann Sjogren, DPM  Accessible via secure chat for questions or concerns.

## 2022-09-13 DIAGNOSIS — S82851A Displaced trimalleolar fracture of right lower leg, initial encounter for closed fracture: Secondary | ICD-10-CM | POA: Diagnosis not present

## 2022-09-13 MED ORDER — LORATADINE 10 MG PO TABS
10.0000 mg | ORAL_TABLET | Freq: Every day | ORAL | Status: DC
  Administered 2022-09-13: 10 mg via ORAL
  Filled 2022-09-13: qty 1

## 2022-09-13 MED ORDER — HALOPERIDOL LACTATE 5 MG/ML IJ SOLN: 1.0000 mg | Freq: Four times a day (QID) | INTRAMUSCULAR | Status: DC | PRN

## 2022-09-13 MED ORDER — IPRATROPIUM-ALBUTEROL 0.5-2.5 (3) MG/3ML IN SOLN
3.0000 mL | Freq: Two times a day (BID) | RESPIRATORY_TRACT | Status: DC
  Administered 2022-09-13: 3 mL via RESPIRATORY_TRACT
  Filled 2022-09-13: qty 3

## 2022-09-13 NOTE — Progress Notes (Signed)
Pt states she is going home as she is not satisfied with medical provider's care. She states she has not been informed of plan of care and is frustrated. Husband is at bedside and states he will drive patient home and assume care for her. Provider called and notified. This RN updated patient and spouse with plan of care per provider's note. Patient remains frustrated and voicing her wish to go home and sign out AMA. AMA discussed with pt and spouse including risks involved which include decline  of condition. Signing AMA releases [facility] from all liability and responsibility from any ill effects or bad outcome. Discussed importance of follow-up care. Patient and spouse verbalize comprehension.  Dr Para March called about situation and discusses plan of care with patient and spouse. Patient states she wishes to go home and get care at [facility].  Discussed care of fracture with patient and spouse who both verbalize comprehension. Wheelchair to car. Son arrives to lobby and states is taking patient to [facility] immediately.  Chart printed for patient for continuity of care. Discharged with all belongings.  Patient and spouse refuse to sign AMA form. Discussed content of AMA form with patient and spouse who state comprehension.

## 2022-09-13 NOTE — TOC Initial Note (Signed)
Transition of Care [facility]) - Progression Note    Patient Details  Name: Deborah Herrera MRN: [id number] Date of Birth: 04/09/1945  Transition of Care [facility]) CM/SW Contact  Garret Reddish, RN Phone Number: 09/13/2022, 2:49 PM  Clinical Narrative:   Chart reviewed.  Noted that patient was admitted with Right tri-malleolar fracture, left anterior process of calcaneus fracture.   I have spoken with  Mrs. Lippmann.  She reported that prior to admission she lived at home with her husband.  She reported that she was independent of ADL's and was able to drive prior to admission.  She reports that she and her husband are retired.  She reports that she did not have to use any assistive devices to ambulate with.  She informs me that she was on her way to get her nails done and she fall.    I have discussed my role as Case Manager and informed her that would assist with discharge planning.    Noted that patient will have ORIF of right ankle on 09-15-2022.    TOC will continue to follow for discharge planning.      Expected Discharge Plan:  (Pending Medical work up home with hh v/s SNF) Barriers to Discharge: No Barriers Identified  Expected Discharge Plan and Services                                               Social Determinants of Health (SDOH) Interventions SDOH Screenings   Food Insecurity: No Food Insecurity (09/11/2022)  Housing: Low Risk  (09/11/2022)  Transportation Needs: No Transportation Needs (09/11/2022)  Utilities: Not At Risk (09/11/2022)  Tobacco Use: Medium Risk (09/11/2022)    Readmission Risk Interventions     No data to display

## 2022-09-13 NOTE — Plan of Care (Signed)
  Problem: Education: Goal: Knowledge of General Education information will improve Description: Including pain rating scale, medication(s)/side effects and non-pharmacologic comfort measures Outcome: Progressing   Problem: Pain Managment: Goal: General experience of comfort will improve Outcome: Progressing   Problem: Safety: Goal: Ability to remain free from injury will improve Outcome: Progressing   Problem: Skin Integrity: Goal: Risk for impaired skin integrity will decrease Outcome: Progressing   Problem: Elimination: Goal: Will not experience complications related to bowel motility Outcome: Progressing

## 2022-09-13 NOTE — Progress Notes (Addendum)
Progress Note   Patient: Deborah Herrera ZOX:[id number] DOB: 1945-05-16 DOA: 09/11/2022     2 DOS: the patient was seen and examined on 09/13/2022   Brief hospital course: 77 year old female with PMH Of Chronic COPD, Hyperlipidemia, Pre-Diabetes, and Frequent Falls who presented to the ED s/p mechanical fall from the stairs found to have displaced right ankle trimalleolar fracture and comminuted fracture of the left calcaneus.  No head injury, LOC, chest pain, shortness of breath.  Ortho recommends ORIF of right ankle on Monday.  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   Assessment and Plan: Right trimalleolar fracture s/p mechanical fall: - Ortho plans right ankle ORIF on Monday 8/5.  Keep NPO on Sunday at midnight.  Appreciate assistance and recommendations. - Pain meds PRN.  Left Ankle Fracture: - Ortho recommends conservative management.  Recurrent Falls / Physical Deconditioning: - PT/OT will be consulted after surgery.  Acute Hypoxic Respiratory Failure, resolved: Patient was initially placed on 2L [place]. - Oxygen saturation is stable on room air today. - Perform walk test on day of discharge.  Acute COPD Exacerbation: - This is Day 2 out of 5 of Prednisone 40 mg daily.  Give Haldol PRN for agitation related to steroid use. - Continue Breo and Duo-Nebs BID. - No need for antibiotics.  Pre-Diabetes: A1C is 5.9%. - Monitor glucose daily.  Allergic Rhinitis: - Claritin daily.  Hyperlipidemia: - Statin.  GERD: - PPI.  MDD: - TCA  OA: - NSAID.  Moderate Protein Calorie Malnutrition: - Encourage nutritional supplements.  Diet: Cardiac DVT: Lovenox Dispo: Floors Discharge Plan: PT/OT evaluation will be completed after 8/5 surgery.  Family prefers to discharge home with outpatient PT if able.      Subjective:  Patient has minimal wheezing today. Mood is stable and pleasant. States she feels fine. She requests Cetirizine for her  allergies.  Physical Exam: Vitals:   09/12/22 2025 09/13/22 0734 09/13/22 0905 09/13/22 1411  BP: (!) 140/61  (!) 145/43 (!) 154/58  Pulse: (!) 103  (!) 101 98  Resp:   20 18  Temp: 99 F (37.2 C)  98.1 F (36.7 C) 98.1 F (36.7 C)  TempSrc: Oral     SpO2:  94% 97% (!) 66%  Weight:      Height:       Physical Exam Constitutional:      General: She is in acute distress.  HENT:     Head: Normocephalic and atraumatic.     Mouth/Throat:     Mouth: Mucous membranes are moist.  Eyes:     Extraocular Movements: Extraocular movements intact.     Pupils: Pupils are equal, round, and reactive to light.  Cardiovascular:     Rate and Rhythm: Normal rate and regular rhythm.     Pulses: Normal pulses.     Heart sounds: Normal heart sounds.  Pulmonary:     Effort: Pulmonary effort is normal.     Breath sounds: Wheezing present.  Abdominal:     General: There is no distension.     Palpations: Abdomen is soft.     Tenderness: There is no abdominal tenderness.  Musculoskeletal:     Cervical back: Normal range of motion and neck supple.  Skin:    General: Skin is warm and dry.     Capillary Refill: Capillary refill takes less than 2 seconds.  Neurological:     General: No focal deficit present.  Psychiatric:  Mood and Affect: Mood normal.     Data Reviewed: Lab Results  Component Value Date   WBC 6.5 09/12/2022   HGB 11.1 (L) 09/12/2022   HCT 33.7 (L) 09/12/2022   MCV 89.2 09/12/2022   PLT 166 09/12/2022   Last metabolic panel Lab Results  Component Value Date   GLUCOSE 141 (H) 09/13/2022   NA 132 (L) 09/13/2022   K 3.7 09/13/2022   CL 95 (L) 09/13/2022   CO2 30 09/13/2022   BUN 15 09/13/2022   CREATININE 0.68 09/13/2022   GFRNONAA >60 09/13/2022   CALCIUM 9.4 09/13/2022   PROT 6.6 09/11/2022   ALBUMIN 3.7 09/11/2022   BILITOT 1.0 09/11/2022   ALKPHOS 75 09/11/2022   AST 25 09/11/2022   ALT 25 09/11/2022   ANIONGAP 7 09/13/2022     Family  Communication: Discussed with family at bedside.  Disposition: Status is: Inpatient Remains inpatient appropriate because: pending 8/5 ORIF  Planned Discharge Destination: PT/OT evaluation pending.    Time spent: >60 minutes  Author: Baldwin Jamaica, MD 09/13/2022 7:10 PM  For on call review [email or web address].

## 2022-09-14 NOTE — Plan of Care (Signed)

## 2022-09-14 NOTE — Discharge Summary (Signed)
Physician Discharge Summary   Patient: Deborah Herrera MRN: [id number] DOB: 30-Jul-1945  Admit date:     09/11/2022  Discharge date: 09/13/2022  Discharge Physician: Andris Baumann   PCP: Alm Bustard, NP                             AMA Discharge   Diagnoses: Principal Problem:   Closed avulsion fracture of left ankle Active Problems:   Fall from stairs   Prediabetes   Wheezing   Trimalleolar fracture, right, closed, initial encounter  Resolved Problems:   * No resolved hospital problems. *  Hospital Course: 77 year old female with PMH Of Chronic COPD, Hyperlipidemia, Pre-Diabetes, and Frequent Falls who presented to the ED s/p mechanical fall from the stairs found to have displaced right ankle trimalleolar fracture and comminuted fracture of the left calcaneus. No head injury, LOC, chest pain, shortness of breath. Ortho recommends ORIF of right ankle on Monday. 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  Patient had her fracture reduced and splinted and was awaiting her procedure scheduled for 8/6 by podiatry. On the night of 8/3 patient told the nursing staff that was displeased with how things were proceeding at the hospital and she called her husband to pick her up.  On arrival of her husband I went to the room and spoke with both of them.  They were very upset about the delay in patient's procedure and the husband stated that he agreed with his wife's decision.  The plan is to go to another hospital either the same night or the following day.  They were made aware that there is a possibility that this would lead to a delay in her care with resulting disability given bilateral fractures. Patient at this time expresses desire to leave the Hospital immediately, patient has been advised that this is not Medically advisable at this time, and can result in Medical complications like  falls with further injury, delayed healing and disability with related complications  including death.  Patient and husband have nonetheless decided that leaving the hospital is in the patient's best interest and they proceeded to sign out AGAINST MEDICAL ADVICE    Assessment and plan per earlier progress note:  Assessment and Plan: * Closed avulsion fracture of left ankle Patient is presenting with bilateral ankle fractures after mechanical fall down the bottom of the stairs.  She has undergone reduction of displaced trimalleolar fracture with improved alignment.  - Podiatry consulted; appreciate their recommendations - Plan for the OR either tomorrow or the day after per podiatry - Regular diet - N.p.o. after midnight - Tylenol, oxycodone and Dilaudid for pain control  Fall from stairs Patient's fall was mechanical in nature with no prodromal symptoms including dizziness, chest pain, shortness of breath.  Unfortunately, she did fall down approximately 4-5 stairs.  No head injuries.  - PT/OT after surgery  Prediabetes A1c of 6.1% approximately 8 months ago.  - A1c pending - No indication for SSI  Wheezing On examination, patient had some mild intermittent wheezing.  Given previous tobacco use and episodes of frequent bronchitis in the past, I do have a strong suspicion for underlying COPD.  Patient states she uses albuterol daily.  - Recommend outpatient referral for PFTs - Continue home albuterol      The results of significant diagnostics from this hospitalization (including imaging, microbiology, ancillary and laboratory) are listed below for  reference.   Imaging Studies: DG Chest Port 1 View  Result Date: 09/12/2022 CLINICAL DATA:  Hypoxia. EXAM: PORTABLE CHEST 1 VIEW COMPARISON:  February 13, 2021. FINDINGS: Stable cardiomediastinal silhouette. Stable reticular densities are noted throughout both lungs which may represent scarring, although acute superimposed edema or atypical inflammation cannot be excluded. Bony thorax is unremarkable. IMPRESSION: Stable  bilateral reticular densities as noted above. Electronically Signed   By: Lupita Raider M.D.   On: 09/12/2022 14:30   CT Ankle Right Wo Contrast  Result Date: 09/11/2022 CLINICAL DATA:  Ankle trauma, fracture, xray done (Age >= 5y) EXAM: CT OF THE RIGHT ANKLE WITHOUT CONTRAST TECHNIQUE: Multidetector CT imaging of the right ankle was performed according to the standard protocol. Multiplanar CT image reconstructions were also generated. RADIATION DOSE REDUCTION: This exam was performed according to the departmental dose-optimization program which includes automated exposure control, adjustment of the mA and/or kV according to patient size and/or use of iterative reconstruction technique. COMPARISON:  Right ankle radiograph from earlier the same day. FINDINGS: Bones/Joint/Cartilage Overlying fiberglass cast noted. Redemonstration of comminuted and displaced right ankle trimalleolar fractures. In the tibia, the fracture line is vertically oriented with intra-articular extension. Fracture line also extends into the medial malleolus. There is comminuted fracture of the lateral malleolus without intra-articular extension. Ligaments Suboptimally assessed by CT. Muscles and Tendons Within normal limits. Soft tissues Asymmetric soft tissue surrounding the fractured fragments favored to represent hematoma. IMPRESSION: 1. Comminuted and displaced right ankle trimalleolar fractures. Electronically Signed   By: Jules Schick M.D.   On: 09/11/2022 17:40   CT Ankle Left Wo Contrast  Result Date: 09/11/2022 CLINICAL DATA:  Ankle trauma, fracture, xray done (Age >= 5y) EXAM: CT OF THE LEFT ANKLE WITHOUT CONTRAST TECHNIQUE: Multidetector CT imaging of the left ankle was performed according to the standard protocol. Multiplanar CT image reconstructions were also generated. RADIATION DOSE REDUCTION: This exam was performed according to the departmental dose-optimization program which includes automated exposure control,  adjustment of the mA and/or kV according to patient size and/or use of iterative reconstruction technique. COMPARISON:  Ankle radiograph earlier the same day. FINDINGS: Bones/Joint/Cartilage There is acute comminuted fracture of the lateral aspect of the anterior process of calcaneus. There are multiple fracture fragments which are displaced dorsally with largest fragment measuring up to 6 x 7 mm. No other acute fracture or dislocation. No aggressive osseous lesion. Mild degenerative changes of imaged joints. Ligaments Suboptimally assessed by CT. Muscles and Tendons Within normal limits. Soft tissues Small hematoma is seen at the fracture site. IMPRESSION: 1. Acute comminuted fracture of the lateral aspect of the anterior process of calcaneus. Electronically Signed   By: Jules Schick M.D.   On: 09/11/2022 17:29   DG Ankle 2 Views Right  Result Date: 09/11/2022 CLINICAL DATA:  Dislocation reduction. EXAM: RIGHT ANKLE - 2 VIEW COMPARISON:  Right ankle radiographs 09/11/2022, earlier same day. FINDINGS: Interval reduction of the distal tibia with the talar dome. Redemonstration of vertically oriented fracture of the posterior malleolus, with improved alignment. Minimally displaced fracture of the medial malleolus, predominantly with vertical oriented fracture at the junction with the more proximal tibia. Improved alignment of the distal fibular metadiaphyseal fracture with approximately 3 mm cortical step-off medially on frontal view. Moderate lateral malleolar soft tissue swelling. Mild right plantar calcaneal heel spur. IMPRESSION: 1. Interval reduction of the distal tibia with the talar dome. 2. Improved alignment of the trimalleolar fractures. Electronically Signed   By: Kerin Salen.D.  On: 09/11/2022 15:25   DG Ankle Complete Right  Result Date: 09/11/2022 CLINICAL DATA:  Post fall, now with right ankle pain. EXAM: RIGHT ANKLE - COMPLETE 3+ VIEW COMPARISON:  None Available. FINDINGS: There is a  acute, comminuted, displaced trimalleolar ankle fracture with disruption of the ankle mortise, apex anteromedially. Expected adjacent soft tissue swelling. No radiopaque foreign body. Tiny plantar calcaneal spur. Enthesopathic change involving the Achilles tendon insertion site. IMPRESSION: Acute, comminuted, displaced trimalleolar ankle fracture with disruption of the ankle mortise. Electronically Signed   By: Simonne Come M.D.   On: 09/11/2022 12:24   DG Ankle Complete Left  Result Date: 09/11/2022 CLINICAL DATA:  Post fall, now with left ankle pain. EXAM: LEFT ANKLE COMPLETE - 3+ VIEW COMPARISON:  None Available. FINDINGS: Soft tissue swelling about the lateral malleolus this finding is associated with an apparent avulsion fracture involving the lateral aspect of the presumed calcaneus, only seen on the provided AP radiograph. Joint spaces are preserved. Note is made of a small ankle joint effusion. Tiny plantar calcaneal spur. Enthesopathic change involving the Achilles tendon insertion site. IMPRESSION: Suspected acute avulsion fracture involving the lateral aspect of the presumed calcaneus, only seen on the provided AP radiograph. Further evaluation with ankle CT could be performed as indicated. Electronically Signed   By: Simonne Come M.D.   On: 09/11/2022 12:22    Microbiology: Results for orders placed or performed during the hospital encounter of 09/11/22  Surgical pcr screen     Status: None   Collection Time: 09/11/22  5:37 PM   Specimen: Nasal Mucosa; Nasal Swab  Result Value Ref Range Status   MRSA, PCR NEGATIVE NEGATIVE Final   Staphylococcus aureus NEGATIVE NEGATIVE Final    Comment: (NOTE) The Xpert SA Assay (FDA approved for NASAL specimens in patients 84 years of age and older), is one component of a comprehensive surveillance program. It is not intended to diagnose infection nor to guide or monitor treatment. Performed at [facility], [address]  [place], [place] [address]     Labs: CBC: Recent Labs  Lab 09/11/22 1516 09/12/22 0549  WBC 7.6 6.5  NEUTROABS 6.1  --   HGB 11.7* 11.1*  HCT 34.6* 33.7*  MCV 87.8 89.2  PLT 172 166   Basic Metabolic Panel: Recent Labs  Lab 09/11/22 1516 09/12/22 0549 09/13/22 0836  NA 138 134* 132*  K 4.2 4.4 3.7  CL 100 98 95*  CO2 29 30 30   GLUCOSE 122* 104* 141*  BUN 13 14 15   CREATININE 0.77 0.84 0.68  CALCIUM 8.5* 8.5* 9.4   Liver Function Tests: Recent Labs  Lab 09/11/22 1516  AST 25  ALT 25  ALKPHOS 75  BILITOT 1.0  PROT 6.6  ALBUMIN 3.7   CBG: No results for input(s): "GLUCAP" in the last 168 hours.  Discharge time spent: greater than 30 minutes.  Signed: Andris Baumann, MD [facility] 09/14/2022

## 2022-09-15 SURGERY — OPEN REDUCTION INTERNAL FIXATION (ORIF) ANKLE FRACTURE
Anesthesia: Choice | Site: Ankle | Laterality: Right

## 2023-09-28 IMAGING — MG MM DIGITAL SCREENING BILAT W/ TOMO AND CAD
8 series · 8 of 24 positions shown · non-contrast
Comparison: Previous exam(s).

CLINICAL DATA: Screening.

EXAM:
DIGITAL SCREENING BILATERAL MAMMOGRAM WITH TOMOSYNTHESIS AND CAD
TECHNIQUE: Bilateral screening digital craniocaudal and mediolateral oblique
mammograms were obtained. Bilateral screening digital breast
tomosynthesis was performed. The images were evaluated with
computer-aided detection.

[R MLO synth-2D]
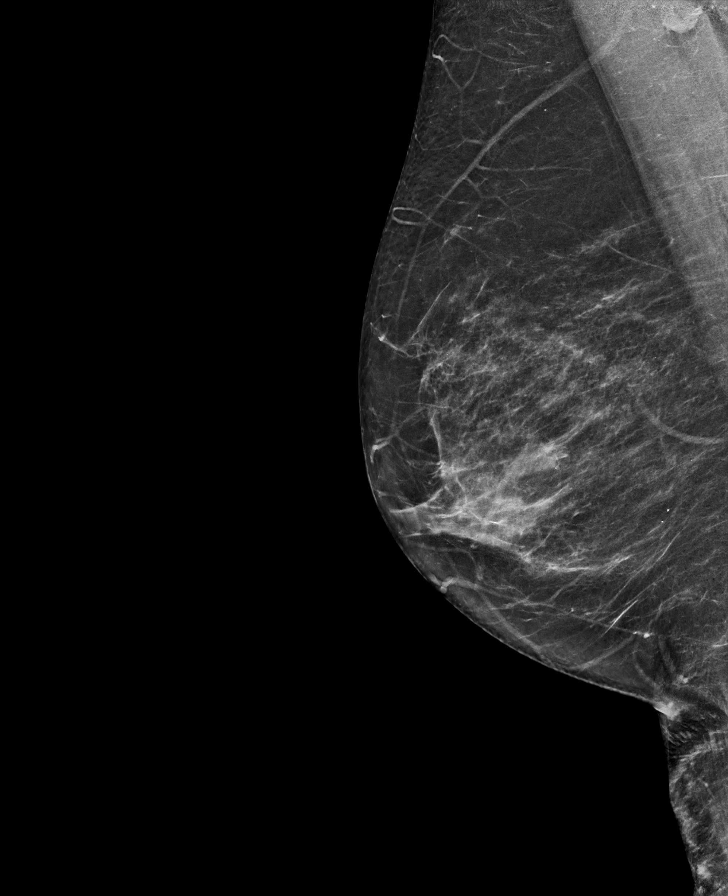

[L CC synth-2D]
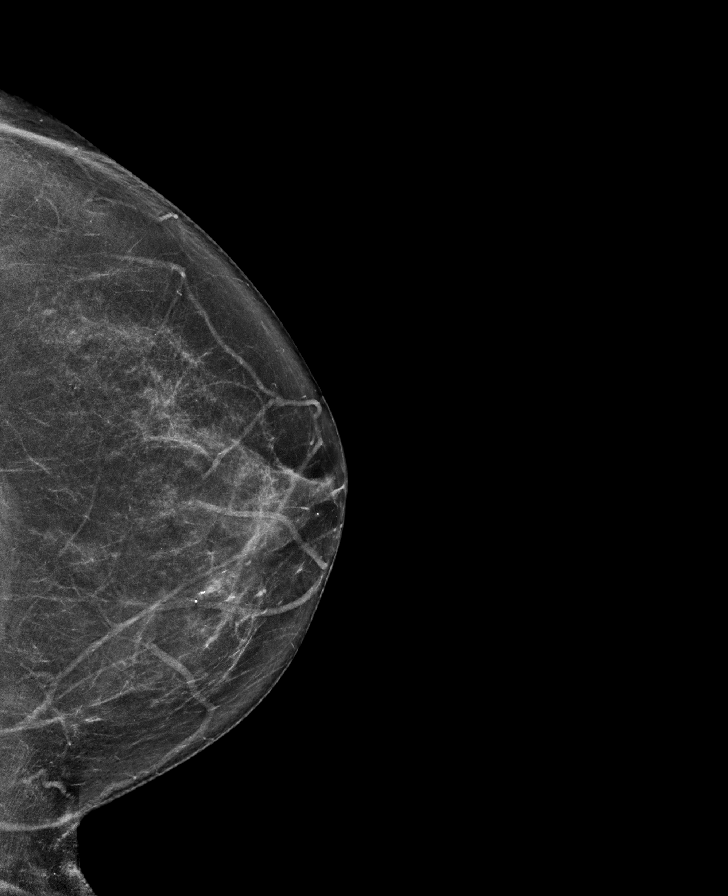

[R CC synth-2D]
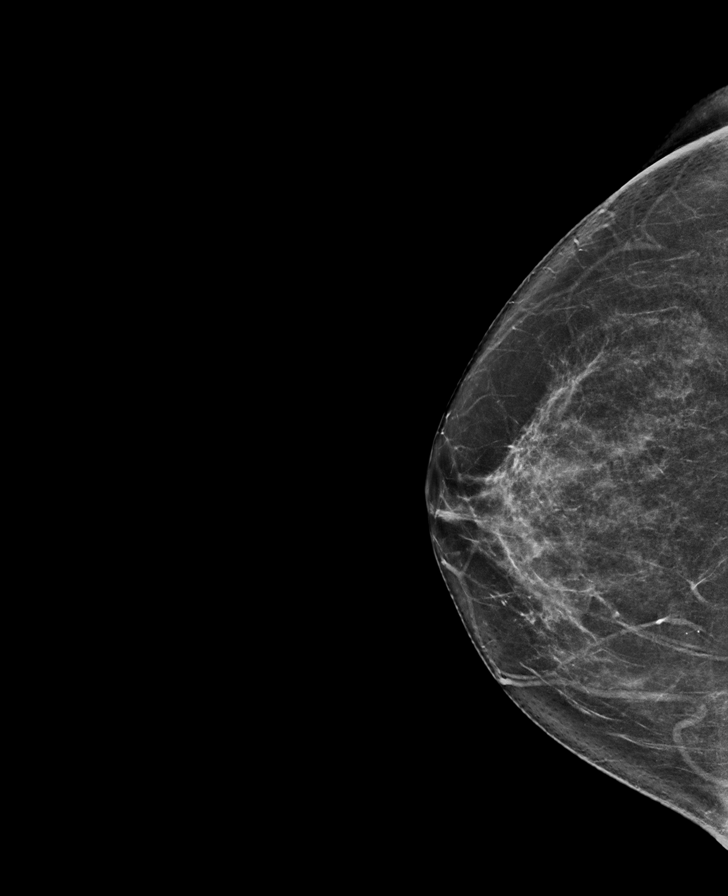

[L MLO synth-2D]
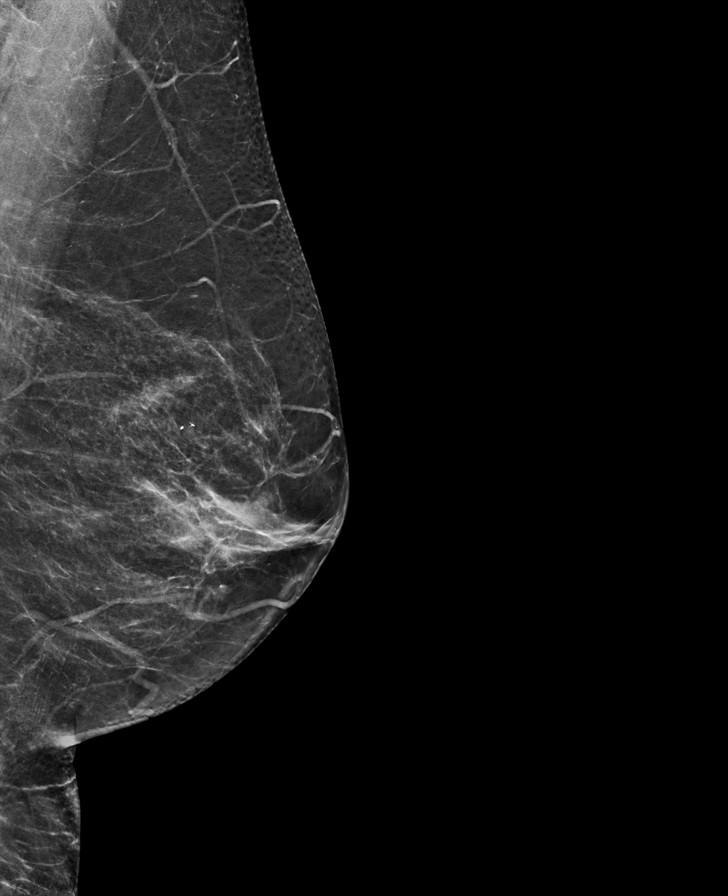

[R MLO tomo · tomo slice 36/71.0]
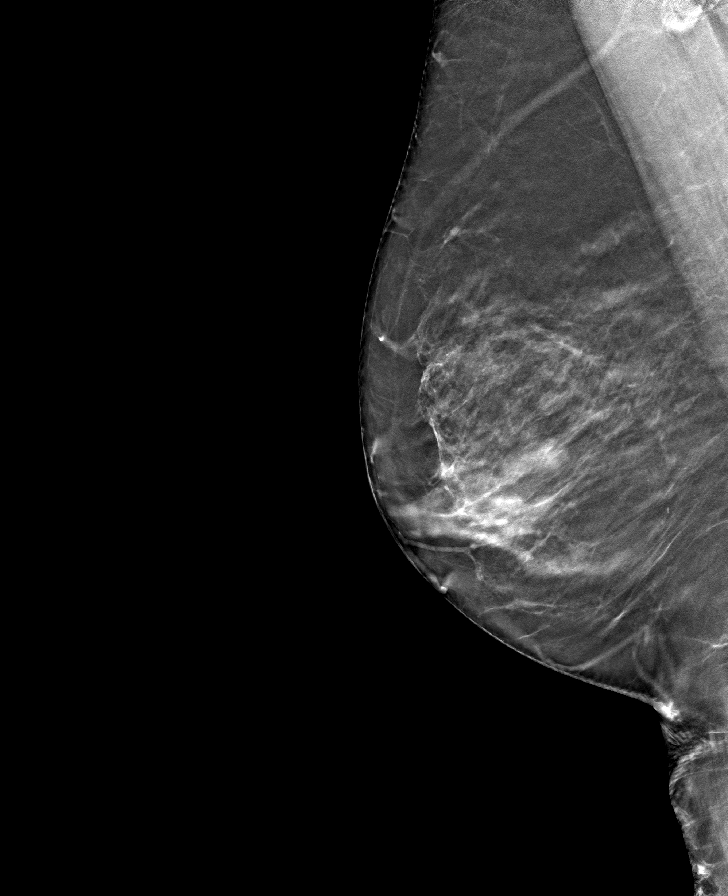

[L MLO tomo · tomo slice 35/69.0]
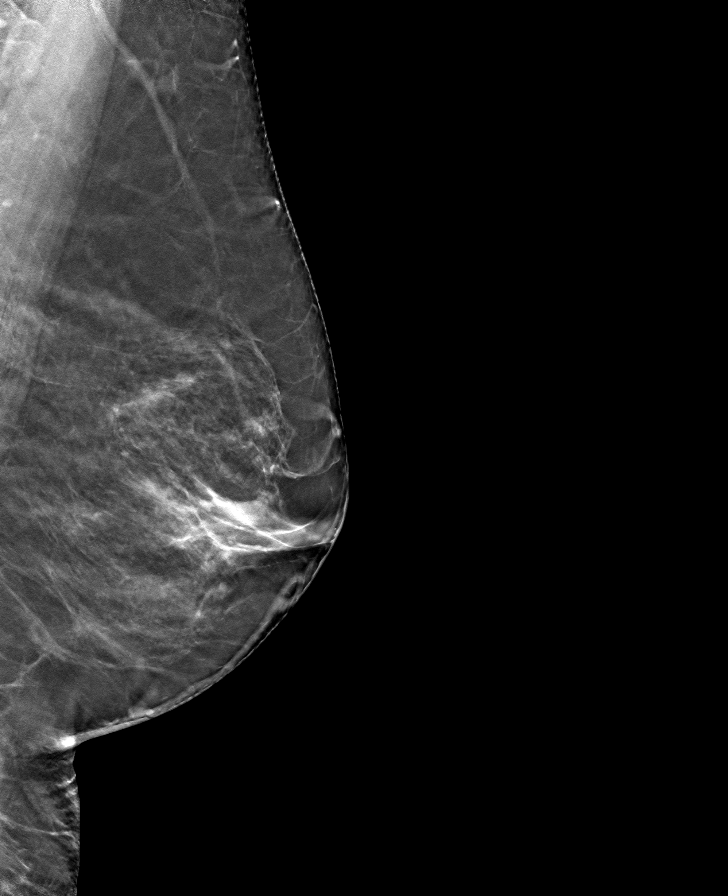

[R CC tomo · tomo slice 37/74.0]
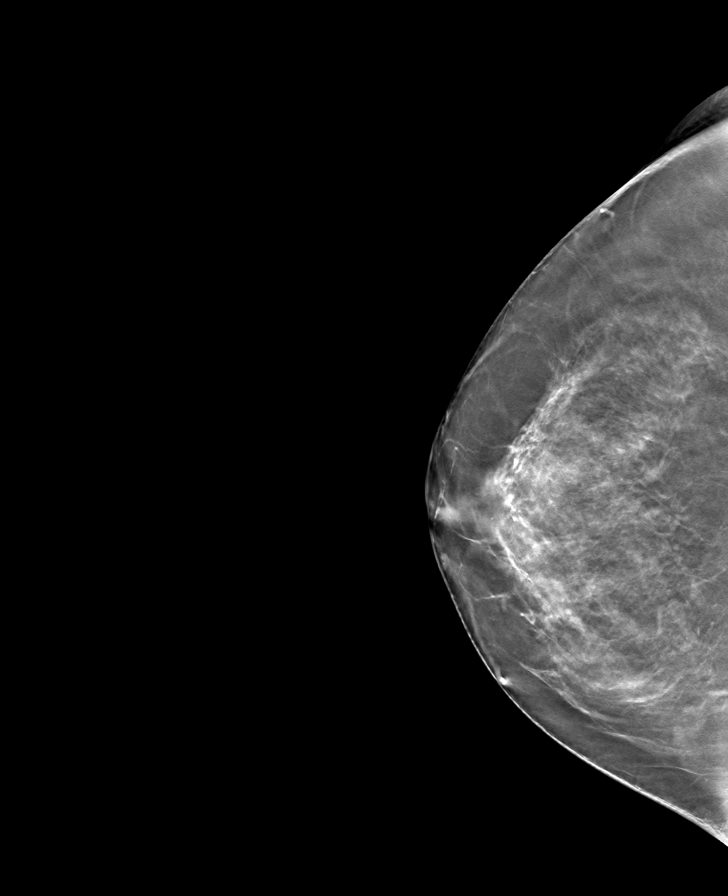

[L CC tomo · tomo slice 37/74.0]
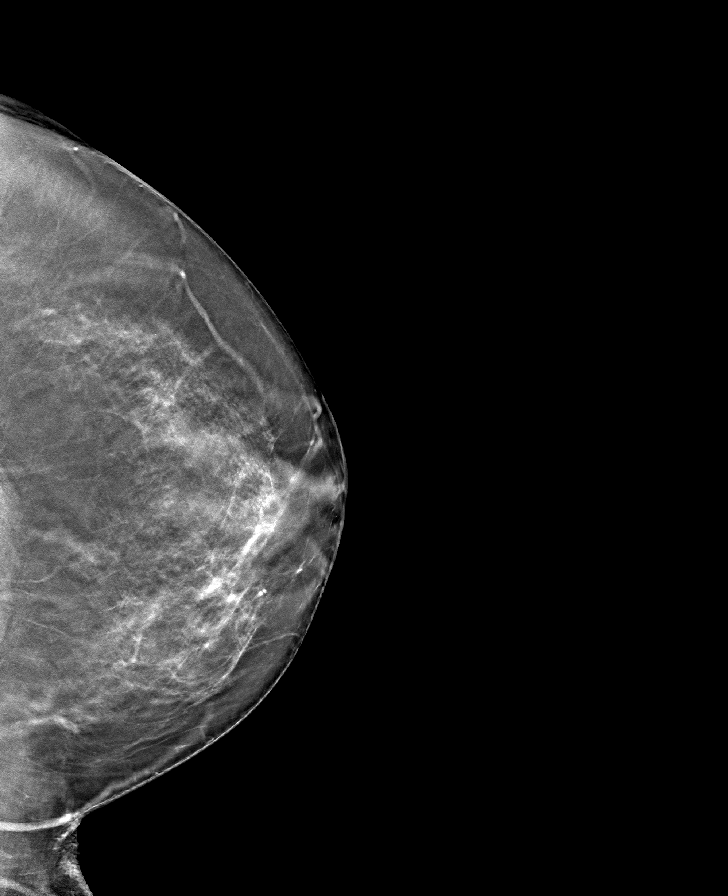

[8 of 24 positions shown; findings below may reference images not displayed]

ACR Breast Density Category b: There are scattered areas of
fibroglandular density.
FINDINGS: There are no findings suspicious for malignancy.
IMPRESSION: No mammographic evidence of malignancy. A result letter of this
screening mammogram will be mailed directly to the patient.

RECOMMENDATION:
Screening mammogram in one year. (Code:51-O-LD2)

BI-RADS CATEGORY  1: Negative.
